# Patient Record
Sex: Female | Born: 1940 | Race: Black or African American | Hispanic: No | Marital: Single | Smoking: Never smoker
Health system: Southern US, Community
[De-identification: ages and names within clinical notes are randomized; demographics above are authoritative.]

## PROBLEM LIST (undated history)

## (undated) DIAGNOSIS — I10 Essential (primary) hypertension: Secondary | ICD-10-CM

## (undated) DIAGNOSIS — J189 Pneumonia, unspecified organism: Secondary | ICD-10-CM

## (undated) DIAGNOSIS — M1990 Unspecified osteoarthritis, unspecified site: Secondary | ICD-10-CM

## (undated) HISTORY — PX: ABDOMINAL HYSTERECTOMY: SHX81

## (undated) HISTORY — PX: FOOT ARTHROPLASTY: SHX1657

## (undated) HISTORY — PX: COLONOSCOPY: SHX174

---

## 1998-01-16 ENCOUNTER — Encounter: Admission: RE | Admit: 1998-01-16 | Discharge: 1998-01-16 | Payer: Self-pay | Admitting: Family Medicine

## 1998-03-16 ENCOUNTER — Ambulatory Visit (HOSPITAL_COMMUNITY): Admission: RE | Admit: 1998-03-16 | Discharge: 1998-03-16 | Payer: Self-pay | Admitting: Gastroenterology

## 1998-05-22 ENCOUNTER — Encounter: Admission: RE | Admit: 1998-05-22 | Discharge: 1998-05-22 | Payer: Self-pay | Admitting: Family Medicine

## 1998-05-25 ENCOUNTER — Encounter: Admission: RE | Admit: 1998-05-25 | Discharge: 1998-05-25 | Payer: Self-pay | Admitting: Family Medicine

## 1999-05-31 ENCOUNTER — Encounter: Admission: RE | Admit: 1999-05-31 | Discharge: 1999-05-31 | Payer: Self-pay | Admitting: Family Medicine

## 1999-06-21 ENCOUNTER — Encounter: Admission: RE | Admit: 1999-06-21 | Discharge: 1999-06-21 | Payer: Self-pay | Admitting: Family Medicine

## 1999-07-06 ENCOUNTER — Other Ambulatory Visit: Admission: RE | Admit: 1999-07-06 | Discharge: 1999-07-06 | Payer: Self-pay | Admitting: Obstetrics and Gynecology

## 1999-07-27 ENCOUNTER — Encounter: Payer: Self-pay | Admitting: Obstetrics and Gynecology

## 1999-07-27 ENCOUNTER — Encounter: Admission: RE | Admit: 1999-07-27 | Discharge: 1999-07-27 | Payer: Self-pay | Admitting: Obstetrics and Gynecology

## 1999-10-22 ENCOUNTER — Encounter: Admission: RE | Admit: 1999-10-22 | Discharge: 1999-10-22 | Payer: Self-pay | Admitting: Family Medicine

## 1999-10-25 ENCOUNTER — Encounter: Admission: RE | Admit: 1999-10-25 | Discharge: 1999-10-25 | Payer: Self-pay | Admitting: Family Medicine

## 1999-11-25 ENCOUNTER — Encounter: Admission: RE | Admit: 1999-11-25 | Discharge: 1999-11-25 | Payer: Self-pay | Admitting: Family Medicine

## 2000-06-26 ENCOUNTER — Encounter: Admission: RE | Admit: 2000-06-26 | Discharge: 2000-06-26 | Payer: Self-pay | Admitting: Family Medicine

## 2000-09-07 ENCOUNTER — Encounter: Admission: RE | Admit: 2000-09-07 | Discharge: 2000-09-07 | Payer: Self-pay | Admitting: Family Medicine

## 2000-09-12 ENCOUNTER — Encounter: Admission: RE | Admit: 2000-09-12 | Discharge: 2000-09-28 | Payer: Self-pay | Admitting: Sports Medicine

## 2001-05-10 ENCOUNTER — Encounter: Admission: RE | Admit: 2001-05-10 | Discharge: 2001-05-10 | Payer: Self-pay | Admitting: Sports Medicine

## 2001-05-11 ENCOUNTER — Encounter: Admission: RE | Admit: 2001-05-11 | Discharge: 2001-05-11 | Payer: Self-pay | Admitting: Family Medicine

## 2001-05-14 ENCOUNTER — Encounter: Admission: RE | Admit: 2001-05-14 | Discharge: 2001-05-14 | Payer: Self-pay | Admitting: Family Medicine

## 2001-09-26 ENCOUNTER — Encounter: Admission: RE | Admit: 2001-09-26 | Discharge: 2001-09-26 | Payer: Self-pay | Admitting: Family Medicine

## 2001-10-17 ENCOUNTER — Encounter: Payer: Self-pay | Admitting: General Surgery

## 2001-10-24 ENCOUNTER — Encounter (INDEPENDENT_AMBULATORY_CARE_PROVIDER_SITE_OTHER): Payer: Self-pay | Admitting: Specialist

## 2001-10-24 ENCOUNTER — Ambulatory Visit (HOSPITAL_COMMUNITY): Admission: RE | Admit: 2001-10-24 | Discharge: 2001-10-24 | Payer: Self-pay | Admitting: General Surgery

## 2002-05-08 ENCOUNTER — Encounter (INDEPENDENT_AMBULATORY_CARE_PROVIDER_SITE_OTHER): Payer: Self-pay | Admitting: *Deleted

## 2002-05-21 ENCOUNTER — Other Ambulatory Visit: Admission: RE | Admit: 2002-05-21 | Discharge: 2002-05-21 | Payer: Self-pay | Admitting: Obstetrics and Gynecology

## 2002-05-29 ENCOUNTER — Encounter: Payer: Self-pay | Admitting: Obstetrics and Gynecology

## 2002-05-29 ENCOUNTER — Encounter: Admission: RE | Admit: 2002-05-29 | Discharge: 2002-05-29 | Payer: Self-pay | Admitting: Obstetrics and Gynecology

## 2002-06-10 ENCOUNTER — Encounter: Admission: RE | Admit: 2002-06-10 | Discharge: 2002-06-10 | Payer: Self-pay | Admitting: Family Medicine

## 2002-11-08 ENCOUNTER — Encounter: Admission: RE | Admit: 2002-11-08 | Discharge: 2002-11-08 | Payer: Self-pay | Admitting: Family Medicine

## 2002-11-11 ENCOUNTER — Encounter: Admission: RE | Admit: 2002-11-11 | Discharge: 2002-11-11 | Payer: Self-pay | Admitting: Sports Medicine

## 2002-11-11 ENCOUNTER — Encounter: Payer: Self-pay | Admitting: Sports Medicine

## 2002-11-12 ENCOUNTER — Encounter: Admission: RE | Admit: 2002-11-12 | Discharge: 2002-12-31 | Payer: Self-pay | Admitting: Sports Medicine

## 2002-12-05 ENCOUNTER — Encounter: Admission: RE | Admit: 2002-12-05 | Discharge: 2002-12-05 | Payer: Self-pay | Admitting: Family Medicine

## 2003-01-28 ENCOUNTER — Encounter: Admission: RE | Admit: 2003-01-28 | Discharge: 2003-01-28 | Payer: Self-pay | Admitting: Family Medicine

## 2003-03-05 ENCOUNTER — Encounter: Admission: RE | Admit: 2003-03-05 | Discharge: 2003-03-05 | Payer: Self-pay | Admitting: Family Medicine

## 2003-03-07 ENCOUNTER — Encounter: Admission: RE | Admit: 2003-03-07 | Discharge: 2003-03-07 | Payer: Self-pay | Admitting: Family Medicine

## 2003-03-18 ENCOUNTER — Encounter: Admission: RE | Admit: 2003-03-18 | Discharge: 2003-03-18 | Payer: Self-pay | Admitting: Family Medicine

## 2003-04-10 ENCOUNTER — Encounter: Admission: RE | Admit: 2003-04-10 | Discharge: 2003-04-10 | Payer: Self-pay | Admitting: Family Medicine

## 2003-06-09 ENCOUNTER — Encounter: Admission: RE | Admit: 2003-06-09 | Discharge: 2003-06-09 | Payer: Self-pay | Admitting: Family Medicine

## 2003-06-18 ENCOUNTER — Encounter: Admission: RE | Admit: 2003-06-18 | Discharge: 2003-06-18 | Payer: Self-pay | Admitting: Family Medicine

## 2003-09-10 ENCOUNTER — Ambulatory Visit (HOSPITAL_COMMUNITY): Admission: RE | Admit: 2003-09-10 | Discharge: 2003-09-10 | Payer: Self-pay | Admitting: Gastroenterology

## 2003-09-10 ENCOUNTER — Encounter (INDEPENDENT_AMBULATORY_CARE_PROVIDER_SITE_OTHER): Payer: Self-pay | Admitting: *Deleted

## 2003-11-19 ENCOUNTER — Encounter: Admission: RE | Admit: 2003-11-19 | Discharge: 2003-11-19 | Payer: Self-pay | Admitting: Family Medicine

## 2003-12-15 ENCOUNTER — Encounter: Admission: RE | Admit: 2003-12-15 | Discharge: 2004-01-28 | Payer: Self-pay | Admitting: Sports Medicine

## 2003-12-25 ENCOUNTER — Emergency Department (HOSPITAL_COMMUNITY): Admission: EM | Admit: 2003-12-25 | Discharge: 2003-12-25 | Payer: Self-pay | Admitting: Family Medicine

## 2004-02-05 ENCOUNTER — Encounter: Admission: RE | Admit: 2004-02-05 | Discharge: 2004-02-05 | Payer: Self-pay | Admitting: Sports Medicine

## 2004-05-17 ENCOUNTER — Ambulatory Visit: Payer: Self-pay | Admitting: Sports Medicine

## 2004-06-23 ENCOUNTER — Ambulatory Visit: Payer: Self-pay | Admitting: Family Medicine

## 2004-07-07 ENCOUNTER — Ambulatory Visit: Payer: Self-pay | Admitting: Family Medicine

## 2004-07-21 ENCOUNTER — Ambulatory Visit: Payer: Self-pay | Admitting: Sports Medicine

## 2004-10-12 ENCOUNTER — Ambulatory Visit: Payer: Self-pay | Admitting: Family Medicine

## 2004-10-20 ENCOUNTER — Ambulatory Visit: Payer: Self-pay | Admitting: Family Medicine

## 2004-10-27 ENCOUNTER — Ambulatory Visit (HOSPITAL_COMMUNITY): Admission: RE | Admit: 2004-10-27 | Discharge: 2004-10-27 | Payer: Self-pay | Admitting: Family Medicine

## 2004-10-27 ENCOUNTER — Encounter: Payer: Self-pay | Admitting: Cardiology

## 2004-10-27 ENCOUNTER — Ambulatory Visit: Payer: Self-pay | Admitting: Cardiology

## 2005-03-09 ENCOUNTER — Other Ambulatory Visit: Admission: RE | Admit: 2005-03-09 | Discharge: 2005-03-09 | Payer: Self-pay | Admitting: Obstetrics and Gynecology

## 2005-04-15 ENCOUNTER — Encounter: Admission: RE | Admit: 2005-04-15 | Discharge: 2005-04-15 | Payer: Self-pay | Admitting: Obstetrics and Gynecology

## 2005-05-09 ENCOUNTER — Ambulatory Visit: Payer: Self-pay | Admitting: Family Medicine

## 2005-05-24 ENCOUNTER — Ambulatory Visit: Payer: Self-pay | Admitting: Family Medicine

## 2005-11-16 ENCOUNTER — Ambulatory Visit: Payer: Self-pay | Admitting: Family Medicine

## 2005-11-23 ENCOUNTER — Ambulatory Visit: Payer: Self-pay | Admitting: Sports Medicine

## 2005-12-15 ENCOUNTER — Ambulatory Visit: Payer: Self-pay | Admitting: Family Medicine

## 2005-12-16 ENCOUNTER — Ambulatory Visit: Payer: Self-pay | Admitting: Family Medicine

## 2006-01-06 ENCOUNTER — Emergency Department (HOSPITAL_COMMUNITY): Admission: EM | Admit: 2006-01-06 | Discharge: 2006-01-06 | Payer: Self-pay | Admitting: Family Medicine

## 2006-08-08 DIAGNOSIS — M1710 Unilateral primary osteoarthritis, unspecified knee: Secondary | ICD-10-CM

## 2006-08-08 DIAGNOSIS — J309 Allergic rhinitis, unspecified: Secondary | ICD-10-CM

## 2006-08-08 HISTORY — DX: Unilateral primary osteoarthritis, unspecified knee: M17.10

## 2006-08-08 HISTORY — DX: Allergic rhinitis, unspecified: J30.9

## 2006-10-05 DIAGNOSIS — E785 Hyperlipidemia, unspecified: Secondary | ICD-10-CM

## 2006-10-05 DIAGNOSIS — M1710 Unilateral primary osteoarthritis, unspecified knee: Secondary | ICD-10-CM

## 2006-10-05 DIAGNOSIS — J309 Allergic rhinitis, unspecified: Secondary | ICD-10-CM | POA: Insufficient documentation

## 2006-10-05 DIAGNOSIS — IMO0002 Reserved for concepts with insufficient information to code with codable children: Secondary | ICD-10-CM | POA: Insufficient documentation

## 2006-10-05 DIAGNOSIS — J45909 Unspecified asthma, uncomplicated: Secondary | ICD-10-CM | POA: Insufficient documentation

## 2006-10-05 HISTORY — DX: Hyperlipidemia, unspecified: E78.5

## 2006-10-06 ENCOUNTER — Encounter (INDEPENDENT_AMBULATORY_CARE_PROVIDER_SITE_OTHER): Payer: Self-pay | Admitting: *Deleted

## 2008-06-06 ENCOUNTER — Ambulatory Visit (HOSPITAL_COMMUNITY): Admission: RE | Admit: 2008-06-06 | Discharge: 2008-06-06 | Payer: Self-pay | Admitting: Obstetrics and Gynecology

## 2008-08-04 ENCOUNTER — Encounter: Admission: RE | Admit: 2008-08-04 | Discharge: 2008-08-04 | Payer: Self-pay | Admitting: Internal Medicine

## 2008-08-08 DIAGNOSIS — G629 Polyneuropathy, unspecified: Secondary | ICD-10-CM

## 2008-08-08 HISTORY — DX: Polyneuropathy, unspecified: G62.9

## 2009-06-26 DIAGNOSIS — I10 Essential (primary) hypertension: Secondary | ICD-10-CM | POA: Insufficient documentation

## 2009-06-26 DIAGNOSIS — G629 Polyneuropathy, unspecified: Secondary | ICD-10-CM | POA: Insufficient documentation

## 2009-06-26 DIAGNOSIS — E7800 Pure hypercholesterolemia, unspecified: Secondary | ICD-10-CM | POA: Insufficient documentation

## 2009-06-26 DIAGNOSIS — J4540 Moderate persistent asthma, uncomplicated: Secondary | ICD-10-CM | POA: Insufficient documentation

## 2009-06-26 DIAGNOSIS — R7309 Other abnormal glucose: Secondary | ICD-10-CM | POA: Insufficient documentation

## 2009-07-21 DIAGNOSIS — M899 Disorder of bone, unspecified: Secondary | ICD-10-CM | POA: Insufficient documentation

## 2009-07-21 HISTORY — DX: Disorder of bone, unspecified: M89.9

## 2009-08-04 ENCOUNTER — Encounter: Admission: RE | Admit: 2009-08-04 | Discharge: 2009-08-04 | Payer: Self-pay | Admitting: Neurological Surgery

## 2009-11-07 ENCOUNTER — Emergency Department (HOSPITAL_COMMUNITY): Admission: EM | Admit: 2009-11-07 | Discharge: 2009-11-07 | Payer: Self-pay | Admitting: Family Medicine

## 2010-12-24 NOTE — Op Note (Signed)
NAME:  Kirsten Horton, Kirsten Horton                      ACCOUNT NO.:  [id number]   MEDICAL RECORD NO.:  [id number]                   PATIENT TYPE:  AMB   LOCATION:  ENDO                                 FACILITY:  [facility]   PHYSICIAN:  Petra Kuba, M.D.                 DATE OF BIRTH:  Oct 12, 1940   DATE OF PROCEDURE:  09/10/2003  DATE OF DISCHARGE:                                 OPERATIVE REPORT   PROCEDURE:  Colonoscopy with hot biopsy.   INDICATIONS FOR PROCEDURE:  Patient with a family history of colon cancer  for screening.  Consent was signed after risks, benefits, methods, and  options were thoroughly discussed in the past.   MEDICATIONS USED:  Demerol 70, Versed 7.   PROCEDURE:  Rectal inspection was pertinent for external hemorrhoids.  Digital exam was negative.  The video pediatric adjustable colonoscope was  inserted and easily advanced around the colon to the cecum.  It did not  require any abdominal pressure or position changes.  No abnormality was seen  on insertion.  The cecum was identified by the appendiceal orifice and the  ileocecal valve.  In the cecum, a tiny polyp was seen and was carefully hot  biopsied on a setting of 15 using the Erbe machine.  The scope was then  slowly withdrawn.  The prep was adequate.  There was some liquid stool that  required washing and suctioning.  On slow withdrawal through the colon, no  other polyps, diverticula, or other abnormalities were seen as we slowly  withdrew back to the rectum.  Anorectal pull through and retroflexion  confirmed some small hemorrhoids.  The scope was reinserted a short ways up  the left side of the colon, air was suctioned, the scope was removed.  The  patient tolerated the procedure well.  There was no obvious complications.   ENDOSCOPIC DIAGNOSIS:  1. Internal and external hemorrhoids.  2. Tiny cecal polyp hot biopsied.  3. Otherwise, within normal limits to the cecum.   PLAN:  Await pathology, probably  repeat screening in five years.  Happy to  see back p.r.n.  Otherwise, return care to Dr. Stefano Gaul and the family  practice service for the customary health care maintenance to include yearly  rectals and guaiacs.                                               Petra Kuba, M.D.    MEM/MEDQ  D:  09/10/2003  T:  09/10/2003  Job:  [id number]   cc:   Janine Limbo, M.D.  [address]  [place]  [place] [address]  Fax: [phone]

## 2010-12-24 NOTE — Op Note (Signed)
[facility]  Patient:    Kirsten Horton, Kirsten Horton Visit Number: [id number] MRN: [id number]          Service Type: DSU Location: DAY Attending Physician:  Caleen Essex Dictated by:   Ollen Gross. Vernell Morgans, M.D. Proc. Date: 10/24/01 Admit Date:  10/24/2001                             Operative Report  PREOPERATIVE DIAGNOSIS:  Mass in left shoulder.  POSTOPERATIVE DIAGNOSIS:  Mass in left shoulder.  PROCEDURE:  Excision of mass from the left shoulder, approximately 6 cm.  SURGEON:  Ollen Gross. Vernell Morgans, M.D.  ANESTHESIA:  General via LMA.  DESCRIPTION OF PROCEDURE:  After informed consent was obtained, the patient was brought to the operating room and placed in the supine position on the operating room table. After having induction of general anesthesia, the patients left shoulder was bumped up with a small roll of towels. The patients left shoulder was then prepped with Betadine and draped in the usual sterile manner. A longitudinal incision was made with a #15 blade knife overlying the mass in question. This incision was carried down through the skin and subcutaneous tissues using Bovie electrocautery. Using blunt dissection, the mass was able to be freed from much of the surrounding subcutaneous tissue. Some of the attachments of the mass to the subcutaneous tissue were taken down sharply with the Bovie electrocautery. Once this was complete, the mass was able to be completely separated from the rest of the subcutaneous tissue and removed. This was sent to the pathologist for further evaluation. The wound was examined and found to be hemostatic. The incision was then closed with interrupted 3-0 nylon vertical mattress sutures. Triple antibiotic and sterile dressings were applied. The patient tolerated the procedure well. At the end of the case, all needle, sponge, and instrument counts were correct. The patient was then awakened and taken to the recovery room  in stable condition. Dictated by:   Ollen Gross. Vernell Morgans, M.D. Attending Physician:  Caleen Essex DD:  10/24/01 TD:  10/25/01 Job: [id number]

## 2011-11-15 ENCOUNTER — Encounter (HOSPITAL_COMMUNITY): Payer: Self-pay | Admitting: Pharmacy Technician

## 2011-11-22 ENCOUNTER — Encounter (HOSPITAL_COMMUNITY)
Admission: RE | Admit: 2011-11-22 | Discharge: 2011-11-22 | Disposition: A | Discharge status: Home / Self Care | Payer: Medicare Other | Source: Ambulatory Visit | Attending: Orthopedic Surgery | Admitting: Orthopedic Surgery

## 2011-11-22 ENCOUNTER — Encounter (HOSPITAL_COMMUNITY): Payer: Self-pay

## 2011-11-22 ENCOUNTER — Encounter (HOSPITAL_COMMUNITY)
Admission: RE | Admit: 2011-11-22 | Discharge: 2011-11-22 | Disposition: A | Discharge status: Home / Self Care | Payer: Medicare Other | Source: Ambulatory Visit | Attending: Anesthesiology | Admitting: Anesthesiology

## 2011-11-22 HISTORY — DX: Unspecified osteoarthritis, unspecified site: M19.90

## 2011-11-22 LAB — BASIC METABOLIC PANEL
CO2: 29 mEq/L (ref 19–32)
Calcium: 9.3 mg/dL (ref 8.4–10.5)
GFR calc non Af Amer: 88 mL/min — ABNORMAL LOW (ref >90)
Glucose, Bld: 108 mg/dL — ABNORMAL HIGH (ref 70–99)
Potassium: 4 mEq/L (ref 3.5–5.1)
Sodium: 143 mEq/L (ref 135–145)

## 2011-11-22 LAB — CBC
Hemoglobin: 13 g/dL (ref 12.0–15.0)
MCH: 31.8 pg (ref 26.0–34.0)
MCV: 97.6 fL (ref 78.0–100.0)
Platelets: 266 10*3/uL (ref 150–400)
RBC: 4.09 MIL/uL (ref 3.87–5.11)
WBC: 8.3 10*3/uL (ref 4.0–10.5)

## 2011-11-22 LAB — ABO/RH: ABO/RH(D): A POS

## 2011-11-22 LAB — HEPATIC FUNCTION PANEL
ALT: 10 U/L (ref 0–35)
Bilirubin, Direct: <0.1 mg/dL (ref 0.0–0.3)
Total Protein: 6.9 g/dL (ref 6.0–8.3)

## 2011-11-22 LAB — SURGICAL PCR SCREEN: Staphylococcus aureus: NEGATIVE

## 2011-11-22 LAB — PROTIME-INR: Prothrombin Time: 14.2 seconds (ref 11.6–15.2)

## 2011-11-22 LAB — APTT: aPTT: 30 seconds (ref 24–37)

## 2011-11-22 NOTE — Progress Notes (Signed)
Called for orders. 

## 2011-11-22 NOTE — Pre-Procedure Instructions (Signed)
[address]  11/22/2011   Your procedure is scheduled on:  Wednesday November 30, 2011  Report to [facility] at 0630 AM.  Call this number if you have problems the morning of surgery: [phone]   Remember:   Do not eat food:After Midnight.  May have clear liquids: up to 4 Hours before arrival. (up to 2:30am)  Clear liquids include soda, tea, black coffee, apple or grape juice, broth.  Take these medicines the morning of surgery with A SIP OF WATER: tramadol, lyrica, evista   Do not wear jewelry, make-up or nail polish.  Do not wear lotions, powders, or perfumes. You may wear deodorant.  Do not shave 48 hours prior to surgery.  Do not bring valuables to the hospital.  Contacts, dentures or bridgework may not be worn into surgery.  Leave suitcase in the car. After surgery it may be brought to your room.  For patients admitted to the hospital, checkout time is 11:00 AM the day of discharge.   Patients discharged the day of surgery will not be allowed to drive home.  Name and phone number of your driver: family  Special Instructions: CHG Shower Use Special Wash: 1/2 bottle night before surgery and 1/2 bottle morning of surgery.   Please read over the following fact sheets that you were given: Pain Booklet, Coughing and Deep Breathing, Blood Transfusion Information, Total Joint Packet, MRSA Information and Surgical Site Infection Prevention

## 2011-11-24 NOTE — Consult Note (Signed)
Anesthesia Chart Review:  Patient is a 71 year old female scheduled for a left TKR on 11/30/11.  History includes non-smoker, asthma, arthritis, obesity with BMI 35, TAH, excision of left shoulder mass in 10/2001.  PCP is listed as Dr. Guerry Bruin.  His notes also mention impaired glucose intolerance.    Labs acceptable.  UA is currently scheduled for the day of surgery.  (Apparently, there were no surgeon orders at her PAT appointment.)  CXR on 11/22/11 showed no acute process.  Her EKG shows NSR, minimal voltage criteria for LVH, inferior infarct (age undetermined), cannot rule out anterior infarct (age undetermined).  Her EKG appears stable since 10/17/01.  There are no other EKGs at her PCP office.  There were no CV symptoms documented at her last PCP and PAT visit.  Her last echo was on 10/27/04 and showed: - Grade 1 diastolic dysfunction. Overall left ventricular systolic function was vigorous. Left ventricular ejection fraction was estimated , range being 65 % to 70 %. Left ventricular wall thickness was mildly increased. - The aortic valve was mildly calcified. There was mild aortic valvular regurgitation. - Left atrial size was at the upper limits of normal. - There was mild tricuspid valvular regurgitation. - There was a minimal pericardial effusion, without hemodynamic compromise.  If remains asymptomatic then plan to proceed, as EKG is stable for 10 years and no known CAD.  Anesthesiologist Dr. Chaney Malling has reviewed her EKGs as well and agrees with this plan.

## 2011-11-25 NOTE — Progress Notes (Signed)
Notified Lynne Logan when pt. Was here for PAT visit we had no orders and a UA was not done. Stated to get UA day of surgery.

## 2011-11-29 MED ORDER — VANCOMYCIN HCL IN DEXTROSE 1-5 GM/200ML-% IV SOLN
1000.0000 mg | INTRAVENOUS | Status: AC
  Administered 2011-11-30: 1000 mg via INTRAVENOUS
  Filled 2011-11-29 (×2): qty 200

## 2011-11-29 NOTE — H&P (Signed)
[facility] [address]   [address] [place], [place] [id number] [phone] A Division of [facility]  Loreta Ave, M.D.     Robert A. Thurston Hole, M.D.     Lunette Stands, M.D. Eulas Post, M.D.    Buford Dresser, M.D. Estell Harpin, M.D. Ralene Cork, D.O.          Genene Churn. Barry Dienes, PA-C            Kirstin A. Shepperson, PA-C [place], OPA-C   RE: Kirsten, Horton                                [id number]      DOB: January 29, 1941 PROGRESS NOTE: 11-18-11 Chief complaint: Left knee pain.  History of present illness: 33 one year-old black female with a history of end stage DJD, left knee, and chronic pain.  Returns.  States that knee symptoms are unchanged from previous visit.  She is wanting to proceed with total knee replacement as scheduled.  Patient states that she has a niece and a granddaughter who will be helping her at home post-op.   Current medications: Naproxen, Lyrica, Evista, Advair and Ventolin. Allergies: Penicillin.       Past medical/surgical history: Hypertension, hyperlipidemia, asthma, seasonal allergic rhinitis and osteoporosis.   Review of systems: Patient denies cardiac, pulmonary, GI or GU issues.  Denies fevers or chills.   Family history: Positive for arthritis, cancer and hypertension. Social history: Patient is single and has two children.  Currently retired.    EXAMINATION: Temperature: 98.7.  Respirations: 16.  Blood pressure: 164/81.  Pulse: 75.  Height: 5?2.  Weight: 182 pounds.  Pleasant black female, alert and oriented x 3 and in no acute distress.  Head is normocephalic, a traumatic.  PERRLA, EOMI.  Cervical spine unremarkable.  Lungs: CTA bilaterally.  No wheezes.  Heart: RRR.  S1 and S2.  No murmurs.  Abdomen: Round and non-distended.  NBS x 4.  Soft and non-tender.  Gait antalgic.  Left knee: Decreased range of motion.  Positive crepitus.  Joint line tender.  1-2+ effusion.   Ligaments stable.  Calf non-tender.  Neurovascularly intact.  Skin warm and dry.   IMPRESSION: End stage DJD, left knee, and pain.  Failed conservative treatment.   PLAN:  We will proceed with left total knee replacement as scheduled.  Surgical procedure, along with potential rehab/recovery time discussed.  All questions answered.    Genene Churn. Barry Dienes, PA-C   Electronically verified by Loreta Ave, M.D. JMO:jjh D 11-18-11 T 11-21-11

## 2011-11-30 ENCOUNTER — Encounter (HOSPITAL_COMMUNITY)
Admission: RE | Disposition: A | Discharge status: Home / Self Care | Payer: Self-pay | Source: Ambulatory Visit | Attending: Orthopedic Surgery

## 2011-11-30 ENCOUNTER — Inpatient Hospital Stay (HOSPITAL_COMMUNITY)
Admission: RE | Admit: 2011-11-30 | Discharge: 2011-12-03 | DRG: 470 | Disposition: A | Discharge status: Home / Self Care | Payer: Medicare Other | Source: Ambulatory Visit | Attending: Orthopedic Surgery | Admitting: Orthopedic Surgery

## 2011-11-30 ENCOUNTER — Encounter (HOSPITAL_COMMUNITY): Payer: Self-pay | Admitting: Vascular Surgery

## 2011-11-30 ENCOUNTER — Inpatient Hospital Stay (HOSPITAL_COMMUNITY): Payer: Medicare Other

## 2011-11-30 ENCOUNTER — Ambulatory Visit (HOSPITAL_COMMUNITY): Payer: Medicare Other | Admitting: Vascular Surgery

## 2011-11-30 DIAGNOSIS — IMO0002 Reserved for concepts with insufficient information to code with codable children: Principal | ICD-10-CM | POA: Diagnosis present

## 2011-11-30 DIAGNOSIS — Z471 Aftercare following joint replacement surgery: Secondary | ICD-10-CM

## 2011-11-30 DIAGNOSIS — J45909 Unspecified asthma, uncomplicated: Secondary | ICD-10-CM | POA: Diagnosis present

## 2011-11-30 DIAGNOSIS — E785 Hyperlipidemia, unspecified: Secondary | ICD-10-CM | POA: Diagnosis present

## 2011-11-30 DIAGNOSIS — Z01812 Encounter for preprocedural laboratory examination: Secondary | ICD-10-CM

## 2011-11-30 DIAGNOSIS — I10 Essential (primary) hypertension: Secondary | ICD-10-CM | POA: Diagnosis present

## 2011-11-30 DIAGNOSIS — M1710 Unilateral primary osteoarthritis, unspecified knee: Principal | ICD-10-CM | POA: Diagnosis present

## 2011-11-30 HISTORY — PX: TOTAL KNEE ARTHROPLASTY: SHX125

## 2011-11-30 LAB — URINALYSIS, ROUTINE W REFLEX MICROSCOPIC
Nitrite: NEGATIVE
Specific Gravity, Urine: 1.015 (ref 1.005–1.030)
Urobilinogen, UA: 0.2 mg/dL (ref 0.0–1.0)
pH: 6 (ref 5.0–8.0)

## 2011-11-30 LAB — URINE MICROSCOPIC-ADD ON

## 2011-11-30 SURGERY — ARTHROPLASTY, KNEE, TOTAL
Anesthesia: General | Site: Knee | Laterality: Left | Wound class: Clean

## 2011-11-30 MED ORDER — ALBUTEROL SULFATE HFA 108 (90 BASE) MCG/ACT IN AERS: 2.0000 | INHALATION_SPRAY | Freq: Four times a day (QID) | RESPIRATORY_TRACT | Status: DC | PRN

## 2011-11-30 MED ORDER — WARFARIN VIDEO
Freq: Once | Status: AC
  Administered 2011-12-01: 19:00:00

## 2011-11-30 MED ORDER — SENNOSIDES-DOCUSATE SODIUM 8.6-50 MG PO TABS: 1.0000 | ORAL_TABLET | Freq: Every evening | ORAL | Status: DC | PRN

## 2011-11-30 MED ORDER — FENTANYL CITRATE 0.05 MG/ML IJ SOLN
INTRAMUSCULAR | Status: DC | PRN
  Administered 2011-11-30 (×2): 100 ug via INTRAVENOUS
  Administered 2011-11-30: 50 ug via INTRAVENOUS

## 2011-11-30 MED ORDER — HYDROMORPHONE HCL PF 1 MG/ML IJ SOLN: 0.5000 mg | INTRAMUSCULAR | Status: DC | PRN

## 2011-11-30 MED ORDER — SUCCINYLCHOLINE CHLORIDE 20 MG/ML IJ SOLN
INTRAMUSCULAR | Status: DC | PRN
  Administered 2011-11-30: 100 mg via INTRAVENOUS

## 2011-11-30 MED ORDER — COUMADIN BOOK
Freq: Once | Status: DC
  Filled 2011-11-30: qty 1

## 2011-11-30 MED ORDER — OXYCODONE-ACETAMINOPHEN 5-325 MG PO TABS
1.0000 | ORAL_TABLET | ORAL | Status: DC | PRN
Start: 2011-11-30 — End: 2011-12-03
  Administered 2011-11-30 – 2011-12-02 (×9): 1 via ORAL
  Administered 2011-12-02: 2 via ORAL
  Administered 2011-12-02 (×3): 1 via ORAL
  Administered 2011-12-03 (×2): 2 via ORAL
  Filled 2011-11-30: qty 1
  Filled 2011-11-30: qty 2
  Filled 2011-11-30 (×2): qty 1
  Filled 2011-11-30 (×2): qty 2
  Filled 2011-11-30 (×2): qty 1
  Filled 2011-11-30: qty 2
  Filled 2011-11-30 (×7): qty 1

## 2011-11-30 MED ORDER — ENOXAPARIN SODIUM 30 MG/0.3ML ~~LOC~~ SOLN
30.0000 mg | Freq: Two times a day (BID) | SUBCUTANEOUS | Status: DC
  Administered 2011-12-01 – 2011-12-03 (×5): 30 mg via SUBCUTANEOUS
  Filled 2011-11-30 (×7): qty 0.3

## 2011-11-30 MED ORDER — LACTATED RINGERS IV SOLN
INTRAVENOUS | Status: DC | PRN
  Administered 2011-11-30 (×2): via INTRAVENOUS

## 2011-11-30 MED ORDER — DIPHENHYDRAMINE HCL 12.5 MG/5ML PO ELIX: 12.5000 mg | ORAL_SOLUTION | ORAL | Status: DC | PRN

## 2011-11-30 MED ORDER — ACETAMINOPHEN 10 MG/ML IV SOLN
INTRAVENOUS | Status: DC | PRN
  Administered 2011-11-30: 1000 mg via INTRAVENOUS

## 2011-11-30 MED ORDER — METHOCARBAMOL 500 MG PO TABS
500.0000 mg | ORAL_TABLET | Freq: Four times a day (QID) | ORAL | Status: DC | PRN
  Administered 2011-12-01 – 2011-12-02 (×5): 500 mg via ORAL
  Filled 2011-11-30 (×6): qty 1

## 2011-11-30 MED ORDER — RALOXIFENE HCL 60 MG PO TABS
60.0000 mg | ORAL_TABLET | Freq: Every day | ORAL | Status: DC
  Administered 2011-11-30 – 2011-12-03 (×4): 60 mg via ORAL
  Filled 2011-11-30 (×4): qty 1

## 2011-11-30 MED ORDER — MENTHOL 3 MG MT LOZG: 1.0000 | LOZENGE | OROMUCOSAL | Status: DC | PRN

## 2011-11-30 MED ORDER — HYDROMORPHONE HCL PF 1 MG/ML IJ SOLN
0.2500 mg | INTRAMUSCULAR | Status: DC | PRN
  Administered 2011-11-30 (×3): 0.5 mg via INTRAVENOUS

## 2011-11-30 MED ORDER — ONDANSETRON HCL 4 MG/2ML IJ SOLN: 4.0000 mg | Freq: Four times a day (QID) | INTRAMUSCULAR | Status: DC | PRN

## 2011-11-30 MED ORDER — NEOSTIGMINE METHYLSULFATE 1 MG/ML IJ SOLN
INTRAMUSCULAR | Status: DC | PRN
  Administered 2011-11-30: 4 mg via INTRAVENOUS

## 2011-11-30 MED ORDER — ACETAMINOPHEN 650 MG RE SUPP: 650.0000 mg | Freq: Four times a day (QID) | RECTAL | Status: DC | PRN

## 2011-11-30 MED ORDER — POTASSIUM CHLORIDE IN NACL 20-0.9 MEQ/L-% IV SOLN
INTRAVENOUS | Status: DC
  Administered 2011-11-30: 20:00:00 via INTRAVENOUS
  Filled 2011-11-30 (×8): qty 1000

## 2011-11-30 MED ORDER — LIDOCAINE HCL (CARDIAC) 20 MG/ML IV SOLN
INTRAVENOUS | Status: DC | PRN
  Administered 2011-11-30: 50 mg via INTRAVENOUS

## 2011-11-30 MED ORDER — FLUTICASONE-SALMETEROL 250-50 MCG/DOSE IN AEPB
1.0000 | INHALATION_SPRAY | Freq: Two times a day (BID) | RESPIRATORY_TRACT | Status: DC
  Administered 2011-11-30 – 2011-12-01 (×4): 1 via RESPIRATORY_TRACT
  Filled 2011-11-30: qty 14

## 2011-11-30 MED ORDER — METHOCARBAMOL 100 MG/ML IJ SOLN
500.0000 mg | INTRAVENOUS | Status: AC
  Administered 2011-11-30: 500 mg via INTRAVENOUS
  Filled 2011-11-30: qty 5

## 2011-11-30 MED ORDER — VANCOMYCIN HCL IN DEXTROSE 1-5 GM/200ML-% IV SOLN
1000.0000 mg | Freq: Two times a day (BID) | INTRAVENOUS | Status: AC
  Administered 2011-11-30: 1000 mg via INTRAVENOUS
  Filled 2011-11-30: qty 200

## 2011-11-30 MED ORDER — GLYCOPYRROLATE 0.2 MG/ML IJ SOLN
INTRAMUSCULAR | Status: DC | PRN
  Administered 2011-11-30: 0.6 mg via INTRAVENOUS

## 2011-11-30 MED ORDER — ACETAMINOPHEN 325 MG PO TABS: 650.0000 mg | ORAL_TABLET | Freq: Four times a day (QID) | ORAL | Status: DC | PRN

## 2011-11-30 MED ORDER — DEXAMETHASONE SODIUM PHOSPHATE 10 MG/ML IJ SOLN
INTRAMUSCULAR | Status: DC | PRN
  Administered 2011-11-30: 10 mg via INTRAVENOUS

## 2011-11-30 MED ORDER — PROPOFOL 10 MG/ML IV EMUL
INTRAVENOUS | Status: DC | PRN
  Administered 2011-11-30: 200 mg via INTRAVENOUS

## 2011-11-30 MED ORDER — PHENOL 1.4 % MT LIQD: 1.0000 | OROMUCOSAL | Status: DC | PRN

## 2011-11-30 MED ORDER — PNEUMOCOCCAL VAC POLYVALENT 25 MCG/0.5ML IJ INJ
0.5000 mL | INJECTION | INTRAMUSCULAR | Status: AC
  Administered 2011-12-01: 0.5 mL via INTRAMUSCULAR
  Filled 2011-11-30: qty 0.5

## 2011-11-30 MED ORDER — ROCURONIUM BROMIDE 100 MG/10ML IV SOLN
INTRAVENOUS | Status: DC | PRN
  Administered 2011-11-30: 40 mg via INTRAVENOUS

## 2011-11-30 MED ORDER — ONDANSETRON HCL 4 MG/2ML IJ SOLN
INTRAMUSCULAR | Status: DC | PRN
  Administered 2011-11-30: 4 mg via INTRAVENOUS

## 2011-11-30 MED ORDER — METHOCARBAMOL 100 MG/ML IJ SOLN
500.0000 mg | Freq: Four times a day (QID) | INTRAVENOUS | Status: DC | PRN
  Filled 2011-11-30: qty 5

## 2011-11-30 MED ORDER — WARFARIN - PHARMACIST DOSING INPATIENT: Freq: Every day | Status: DC

## 2011-11-30 MED ORDER — FLEET ENEMA 7-19 GM/118ML RE ENEM: 1.0000 | ENEMA | Freq: Once | RECTAL | Status: AC | PRN

## 2011-11-30 MED ORDER — MIDAZOLAM HCL 5 MG/5ML IJ SOLN
INTRAMUSCULAR | Status: DC | PRN
  Administered 2011-11-30: 2 mg via INTRAVENOUS

## 2011-11-30 MED ORDER — DOCUSATE SODIUM 100 MG PO CAPS
100.0000 mg | ORAL_CAPSULE | Freq: Two times a day (BID) | ORAL | Status: DC
  Administered 2011-11-30 – 2011-12-03 (×6): 100 mg via ORAL
  Filled 2011-11-30 (×8): qty 1

## 2011-11-30 MED ORDER — SODIUM CHLORIDE 0.9 % IR SOLN
Status: DC | PRN
  Administered 2011-11-30: 1000 mL
  Administered 2011-11-30: 3000 mL

## 2011-11-30 MED ORDER — BUPIVACAINE-EPINEPHRINE PF 0.5-1:200000 % IJ SOLN
INTRAMUSCULAR | Status: DC | PRN
  Administered 2011-11-30: 30 mL

## 2011-11-30 MED ORDER — MORPHINE SULFATE 4 MG/ML IJ SOLN
INTRAMUSCULAR | Status: DC | PRN
  Administered 2011-11-30: 4 mg

## 2011-11-30 MED ORDER — PREGABALIN 75 MG PO CAPS
75.0000 mg | ORAL_CAPSULE | Freq: Two times a day (BID) | ORAL | Status: DC
  Administered 2011-11-30 – 2011-12-03 (×6): 75 mg via ORAL
  Filled 2011-11-30 (×6): qty 1

## 2011-11-30 MED ORDER — ONDANSETRON HCL 4 MG PO TABS: 4.0000 mg | ORAL_TABLET | Freq: Four times a day (QID) | ORAL | Status: DC | PRN

## 2011-11-30 MED ORDER — BUPIVACAINE HCL (PF) 0.25 % IJ SOLN
INTRAMUSCULAR | Status: DC | PRN
  Administered 2011-11-30: 30 mL

## 2011-11-30 MED ORDER — WARFARIN SODIUM 7.5 MG PO TABS
7.5000 mg | ORAL_TABLET | Freq: Once | ORAL | Status: AC
  Administered 2011-11-30: 7.5 mg via ORAL
  Filled 2011-11-30: qty 1

## 2011-11-30 SURGICAL SUPPLY — 61 items
BANDAGE ESMARK 6X9 LF (GAUZE/BANDAGES/DRESSINGS) ×1 IMPLANT
BLADE SAG 18X100X1.27 (BLADE) ×4 IMPLANT
BNDG CMPR 9X6 STRL LF SNTH (GAUZE/BANDAGES/DRESSINGS) ×1
BNDG ESMARK 6X9 LF (GAUZE/BANDAGES/DRESSINGS) ×2
BOOTCOVER CLEANROOM LRG (PROTECTIVE WEAR) ×4 IMPLANT
BOWL SMART MIX CTS (DISPOSABLE) ×2 IMPLANT
CEMENT BONE SIMPLEX SPEEDSET (Cement) ×4 IMPLANT
CLOTH BEACON ORANGE TIMEOUT ST (SAFETY) ×2 IMPLANT
COVER BACK TABLE 24X17X13 BIG (DRAPES) ×1 IMPLANT
COVER SURGICAL LIGHT HANDLE (MISCELLANEOUS) ×2 IMPLANT
CUFF TOURNIQUET SINGLE 34IN LL (TOURNIQUET CUFF) ×2 IMPLANT
DRAPE EXTREMITY T 121X128X90 (DRAPE) ×2 IMPLANT
DRAPE PROXIMA HALF (DRAPES) ×2 IMPLANT
DRAPE U-SHAPE 47X51 STRL (DRAPES) ×2 IMPLANT
DRSG PAD ABDOMINAL 8X10 ST (GAUZE/BANDAGES/DRESSINGS) ×1 IMPLANT
DURAPREP 26ML APPLICATOR (WOUND CARE) ×2 IMPLANT
ELECT CAUTERY BLADE 6.4 (BLADE) ×2 IMPLANT
ELECT REM PT RETURN 9FT ADLT (ELECTROSURGICAL) ×2
ELECTRODE REM PT RTRN 9FT ADLT (ELECTROSURGICAL) ×1 IMPLANT
EVACUATOR 1/8 PVC DRAIN (DRAIN) ×2 IMPLANT
FACESHIELD LNG OPTICON STERILE (SAFETY) ×3 IMPLANT
GAUZE XEROFORM 1X8 LF (GAUZE/BANDAGES/DRESSINGS) ×1 IMPLANT
GAUZE XEROFORM 5X9 LF (GAUZE/BANDAGES/DRESSINGS) ×1 IMPLANT
GLOVE BIOGEL PI IND STRL 8 (GLOVE) ×1 IMPLANT
GLOVE BIOGEL PI INDICATOR 8 (GLOVE) ×1
GLOVE ECLIPSE 8.5 STRL (GLOVE) ×1 IMPLANT
GLOVE ORTHO TXT STRL SZ7.5 (GLOVE) ×2 IMPLANT
GLOVE SURG SS PI 7.5 STRL IVOR (GLOVE) ×1 IMPLANT
GLOVE SURG SS PI 8.5 STRL IVOR (GLOVE) ×1
GLOVE SURG SS PI 8.5 STRL STRW (GLOVE) IMPLANT
GOWN PREVENTION PLUS XLARGE (GOWN DISPOSABLE) ×5 IMPLANT
GOWN STRL NON-REIN LRG LVL3 (GOWN DISPOSABLE) ×2 IMPLANT
GOWN STRL REIN 2XL XLG LVL4 (GOWN DISPOSABLE) ×2 IMPLANT
HANDPIECE INTERPULSE COAX TIP (DISPOSABLE) ×2
IMMOBILIZER KNEE 22 UNIV (SOFTGOODS) ×2 IMPLANT
IMMOBILIZER KNEE 24 THIGH 36 (MISCELLANEOUS) IMPLANT
IMMOBILIZER KNEE 24 UNIV (MISCELLANEOUS)
KIT BASIN OR (CUSTOM PROCEDURE TRAY) ×2 IMPLANT
KIT ROOM TURNOVER OR (KITS) ×2 IMPLANT
MANIFOLD NEPTUNE II (INSTRUMENTS) ×2 IMPLANT
NS IRRIG 1000ML POUR BTL (IV SOLUTION) ×2 IMPLANT
PACK TOTAL JOINT (CUSTOM PROCEDURE TRAY) ×2 IMPLANT
PAD ARMBOARD 7.5X6 YLW CONV (MISCELLANEOUS) ×3 IMPLANT
PAD CAST 4YDX4 CTTN HI CHSV (CAST SUPPLIES) ×1 IMPLANT
PADDING CAST COTTON 4X4 STRL (CAST SUPPLIES)
PADDING CAST COTTON 6X4 STRL (CAST SUPPLIES) ×1 IMPLANT
RUBBERBAND STERILE (MISCELLANEOUS) ×2 IMPLANT
SET HNDPC FAN SPRY TIP SCT (DISPOSABLE) ×1 IMPLANT
SPONGE GAUZE 4X4 12PLY (GAUZE/BANDAGES/DRESSINGS) ×1 IMPLANT
STAPLER VISISTAT 35W (STAPLE) ×2 IMPLANT
SUCTION FRAZIER TIP 10 FR DISP (SUCTIONS) ×2 IMPLANT
SUT VIC AB 1 CTX 36 (SUTURE) ×4
SUT VIC AB 1 CTX36XBRD ANBCTR (SUTURE) ×2 IMPLANT
SUT VIC AB 2-0 CT1 27 (SUTURE) ×4
SUT VIC AB 2-0 CT1 TAPERPNT 27 (SUTURE) ×2 IMPLANT
SYR 30ML LL (SYRINGE) ×2 IMPLANT
SYR 30ML SLIP (SYRINGE) ×1 IMPLANT
TOWEL OR 17X24 6PK STRL BLUE (TOWEL DISPOSABLE) ×2 IMPLANT
TOWEL OR 17X26 10 PK STRL BLUE (TOWEL DISPOSABLE) ×2 IMPLANT
TRAY FOLEY CATH 14FR (SET/KITS/TRAYS/PACK) ×2 IMPLANT
WATER STERILE IRR 1000ML POUR (IV SOLUTION) ×4 IMPLANT

## 2011-11-30 NOTE — Anesthesia Postprocedure Evaluation (Signed)
  Anesthesia Post-op Note  Patient: Kirsten Horton  Procedure(s) Performed: Procedure(s) (LRB): TOTAL KNEE ARTHROPLASTY (Left)  Patient Location: PACU  Anesthesia Type: GA combined with regional for post-op pain  Level of Consciousness: awake  Airway and Oxygen Therapy: Patient Spontanous Breathing and Patient connected to nasal cannula oxygen  Post-op Pain: mild  Post-op Assessment: Post-op Vital signs reviewed, Patient's Cardiovascular Status Stable, Respiratory Function Stable, Patent Airway and No signs of Nausea or vomiting  Post-op Vital Signs: Reviewed and stable  Complications: No apparent anesthesia complications

## 2011-11-30 NOTE — Progress Notes (Signed)
ANTICOAGULATION CONSULT NOTE - Initial Consult  Pharmacy Consult for Coumadin Indication: VTE prophylaxis s/p L TKA  Allergies  Allergen Reactions  . Penicillins Hives and Rash    Patient Measurements: Height: 5' (152.4 cm) Weight: 183 lb 12.8 oz (83.371 kg) IBW/kg (Calculated) : 45.5   Vital Signs: Temp: 97.6 F (36.4 C) (04/24 1300) Temp src: Oral (04/24 0718) BP: 146/64 mmHg (04/24 1300) Pulse Rate: 67  (04/24 1300)  Labs: 4/16 INR 1.08 No results found for this basename: HGB:2,HCT:3,PLT:3,APTT:3,LABPROT:3,INR:3,HEPARINUNFRC:3,CREATININE:3,CKTOTAL:3,CKMB:3,TROPONINI:3 in the last 72 hours Estimated Creatinine Clearance: 61.8 ml/min (by C-G formula based on Cr of 0.63).  Medical History: Past Medical History  Diagnosis Date  . Asthma     seasonal  . Arthritis     Medications:  Prescriptions prior to admission  Medication Sig Dispense Refill  . albuterol (PROVENTIL HFA;VENTOLIN HFA) 108 (90 BASE) MCG/ACT inhaler Inhale 2 puffs into the lungs every 6 (six) hours as needed. For shortness of breath      . calcium-vitamin D (OSCAL WITH D) 500-200 MG-UNIT per tablet Take 1 tablet by mouth daily.      . cholecalciferol (VITAMIN D) 1000 UNITS tablet Take 5,000 Units by mouth daily.      . Fluticasone-Salmeterol (ADVAIR) 250-50 MCG/DOSE AEPB Inhale 1 puff into the lungs 2 (two) times daily.      . naproxen (NAPROSYN) 375 MG tablet Take 375 mg by mouth 2 (two) times daily with a meal.      . pregabalin (LYRICA) 75 MG capsule Take 75 mg by mouth 2 (two) times daily.      . raloxifene (EVISTA) 60 MG tablet Take 60 mg by mouth daily.      . traMADol (ULTRAM) 50 MG tablet Take 50 mg by mouth 2 (two) times daily. For pain        Assessment: 71 yo female to begin Coumadin for VTE prophylaxis s/p L TKA. INR is normal at baseline.  Coumadin score = 4  Goal of Therapy:  INR 2-3   Plan:  1. Coumadin 7.5 mg po tonight 2. INR daily 3. Coumadin book and video  Lovell Sheehan 11/30/2011,1:44 PM

## 2011-11-30 NOTE — Transfer of Care (Signed)
Immediate Anesthesia Transfer of Care Note  Patient: Kirsten Horton  Procedure(s) Performed: Procedure(s) (LRB): TOTAL KNEE ARTHROPLASTY (Left)  Patient Location: PACU  Anesthesia Type: General and Regional  Level of Consciousness: awake, alert , oriented and sedated  Airway & Oxygen Therapy: Patient Spontanous Breathing and Patient connected to nasal cannula oxygen  Post-op Assessment: Report given to PACU RN, Post -op Vital signs reviewed and stable and Patient moving all extremities   Post vital signs: Reviewed  Complications: No apparent anesthesia complications

## 2011-11-30 NOTE — Interval H&P Note (Signed)
History and Physical Interval Note:  11/30/2011 8:02 AM  Kirsten Horton  has presented today for surgery, with the diagnosis of TOTAL LEFT KNEE REPLACEMENT  The various methods of treatment have been discussed with the patient and family. After consideration of risks, benefits and other options for treatment, the patient has consented to  Procedure(s) (LRB): TOTAL KNEE ARTHROPLASTY (Left) as a surgical intervention .  The patients' history has been reviewed, patient examined, no change in status, stable for surgery.  I have reviewed the patients' chart and labs.  Questions were answered to the patient's satisfaction.     Lebron Nauert F

## 2011-11-30 NOTE — Anesthesia Preprocedure Evaluation (Signed)
Anesthesia Evaluation  Patient identified by MRN, date of birth, ID band Patient awake    Reviewed: Allergy & Precautions, H&P , NPO status , Patient's Chart, lab work & pertinent test results  Airway Mallampati: II TM Distance: >3 FB Neck ROM: Full    Dental No notable dental hx. (+) Teeth Intact and Dental Advisory Given   Pulmonary asthma ,  breath sounds clear to auscultation  Pulmonary exam normal       Cardiovascular negative cardio ROS  Rhythm:Regular Rate:Normal     Neuro/Psych negative neurological ROS  negative psych ROS   GI/Hepatic negative GI ROS, Neg liver ROS,   Endo/Other  negative endocrine ROS  Renal/GU negative Renal ROS  negative genitourinary   Musculoskeletal   Abdominal   Peds  Hematology negative hematology ROS (+)   Anesthesia Other Findings   Reproductive/Obstetrics negative OB ROS                           Anesthesia Physical Anesthesia Plan  ASA: II  Anesthesia Plan: General   Post-op Pain Management:    Induction: Intravenous  Airway Management Planned: LMA  Additional Equipment:   Intra-op Plan:   Post-operative Plan: Extubation in OR  Informed Consent: I have reviewed the patients History and Physical, chart, labs and discussed the procedure including the risks, benefits and alternatives for the proposed anesthesia with the patient or authorized representative who has indicated his/her understanding and acceptance.   Dental advisory given  Plan Discussed with: CRNA  Anesthesia Plan Comments:         Anesthesia Quick Evaluation  

## 2011-11-30 NOTE — Progress Notes (Signed)
Orthopedic Tech Progress Note Patient Details:  Kirsten Horton [facility] 1940-08-17 [id number] Trapeze bar     Cammer, Mickie Bail 11/30/2011, 1:53 PM

## 2011-11-30 NOTE — Anesthesia Procedure Notes (Signed)
Anesthesia Regional Block:  Femoral nerve block  Pre-Anesthetic Checklist: ,, timeout performed, Correct Patient, Correct Site, Correct Laterality, Correct Procedure, Correct Position, site marked, Risks and benefits discussed, pre-op evaluation,  At surgeon's request and post-op pain management  Laterality: Left  Prep: Maximum Sterile Barrier Precautions used and chloraprep       Needles:  Injection technique: Single-shot  Needle Type: Other   (Arrow 50mm)    Needle Gauge: 22 and 22 G    Additional Needles:  Procedures: nerve stimulator Femoral nerve block  Nerve Stimulator or Paresthesia:  Response: Patellar respose, 0.44 mA,   Additional Responses:   Narrative:  Start time: 11/30/2011 8:10 AM End time: 11/30/2011 8:19 AM Injection made incrementally with aspirations every 5 mL. Anesthesiologist: Farheen Pfahler,MD  Additional Notes: 2% Lidocaine skin wheel.   Femoral nerve block

## 2011-11-30 NOTE — Preoperative (Signed)
Beta Blockers   Reason not to administer Beta Blockers:Not Applicable 

## 2011-11-30 NOTE — Progress Notes (Signed)
Pt is s/p L TKR. Knee precautions. +CMS. Pt is to be WBAT with RW and KI per order. Pt admitted to unit in CPM 0-60 with hemovac clamped per order. Dressing to L knee is dry and intact. Ice to L knee. Lungs CTA but diminished in the bases. Heart rate regular rate and rhythm. No s/sx cardiac or resp distress and no c/o such. Vital signs are stable. Pt is alert and oriented x 3. Pt is lethargic but very arousable due to meds in PACU. Pt denies pain. Pt has a foley draining mod amount of clear yellow urine. Abdomen is soft flat nontender and nondistended. BS hypoactive x4. Pt denies passing gas and denies nausea or vomiting. Pt repts LBM 4/23. Pt oriented to unit protocols, fall precautions, pain regimen as per MD order, IS usage. Pt verb understanding and agrees to comply. Family at bedside and supportive.

## 2011-11-30 NOTE — Brief Op Note (Signed)
11/30/2011  10:54 AM  PATIENT:  Kirsten Horton  71 y.o. female  PRE-OPERATIVE DIAGNOSIS:   DEGENERATIVE JOINT DISEASE LEFT KNEE   POST-OPERATIVE DIAGNOSIS:   DEGENERATIVE JOINT DISEASE LEFT KNEE   PROCEDURE:  Procedure(s) (LRB): TOTAL KNEE ARTHROPLASTY (Left)  SURGEON:  Surgeon(s) and Role:    * Loreta Ave, MD - Primary  PHYSICIAN ASSISTANT: Zonia Kief M    ANESTHESIA:   general  EBL:  Total I/O In: 1000 [I.V.:1000] Out: 200 [Urine:200]  BLOOD ADMINISTERED:none  SPECIMEN:  No Specimen  DISPOSITION OF SPECIMEN:  N/A  COUNTS:  YES  TOURNIQUET:   Total Tourniquet Time Documented: Thigh (Left) - 84 minutes PATIENT DISPOSITION:  PACU - hemodynamically stable.

## 2011-11-30 NOTE — Progress Notes (Signed)
Orthopedic Tech Progress Note Patient Details:  Kirsten Horton [facility] 13-Sep-1940 [id number]  CPM Left Knee CPM Left Knee: On Left Knee Flexion (Degrees): 60  Left Knee Extension (Degrees): 0  Additional Comments: trapeze bar   Cammer, Mickie Bail 11/30/2011, 1:53 PM

## 2011-12-01 ENCOUNTER — Encounter (HOSPITAL_COMMUNITY): Payer: Self-pay | Admitting: *Deleted

## 2011-12-01 LAB — CBC
HCT: 27 % — ABNORMAL LOW (ref 36.0–46.0)
MCHC: 33 g/dL (ref 30.0–36.0)
RDW: 12.7 % (ref 11.5–15.5)
WBC: 8 10*3/uL (ref 4.0–10.5)

## 2011-12-01 LAB — BASIC METABOLIC PANEL
BUN: 10 mg/dL (ref 6–23)
Chloride: 106 mEq/L (ref 96–112)
GFR calc Af Amer: >90 mL/min (ref >90)
GFR calc non Af Amer: 84 mL/min — ABNORMAL LOW (ref >90)
Potassium: 4.1 mEq/L (ref 3.5–5.1)
Sodium: 140 mEq/L (ref 135–145)

## 2011-12-01 LAB — PROTIME-INR
INR: 1.2 (ref 0.00–1.49)
Prothrombin Time: 15.5 seconds — ABNORMAL HIGH (ref 11.6–15.2)

## 2011-12-01 LAB — HEMOGLOBIN AND HEMATOCRIT, BLOOD
HCT: 26.9 % — ABNORMAL LOW (ref 36.0–46.0)
Hemoglobin: 8.8 g/dL — ABNORMAL LOW (ref 12.0–15.0)

## 2011-12-01 MED ORDER — FLUTICASONE-SALMETEROL 250-50 MCG/DOSE IN AEPB
1.0000 | INHALATION_SPRAY | Freq: Two times a day (BID) | RESPIRATORY_TRACT | Status: DC
  Administered 2011-12-02 – 2011-12-03 (×3): 1 via RESPIRATORY_TRACT
  Filled 2011-12-01: qty 14

## 2011-12-01 MED ORDER — WARFARIN SODIUM 7.5 MG PO TABS
7.5000 mg | ORAL_TABLET | Freq: Once | ORAL | Status: AC
  Administered 2011-12-01: 7.5 mg via ORAL
  Filled 2011-12-01: qty 1

## 2011-12-01 MED FILL — Morphine Sulfate Inj 4 MG/ML: INTRAMUSCULAR | Qty: 1 | Status: AC

## 2011-12-01 NOTE — Progress Notes (Signed)
Physical Therapy Evaluation Note  Past Medical History  Diagnosis Date  . Asthma     seasonal  . Arthritis     Past Surgical History  Procedure Date  . Abdominal hysterectomy      12/01/11 0805  PT Visit Information  Last PT Received On 12/01/11  Assistance Needed +1  PT Time Calculation  PT Start Time 0805  PT Stop Time 0832  PT Time Calculation (min) 27 min  Subjective Data  Subjective Pt received supine in bed with c/o 6/10 L knee pain.  Precautions  Precautions Knee  Required Braces or Orthoses Knee Immobilizer - Left  Knee Immobilizer - Left On when out of bed or walking  Restrictions  LLE Weight Bearing WBAT  Home Living  Lives With Family (niece and granddaughters)  Available Help at Discharge Family;Available 24 hours/day  Type of Home House  Home Access Stairs to enter  Entrance Stairs-Number of Steps 2  Entrance Stairs-Rails Can reach both  Home Layout One level  Bathroom Shower/Tub Tub/shower unit;Curtain  Bathroom Toilet Handicapped height  Bathroom Accessibility Yes  How Accessible Accessible via walker  Home Adaptive Equipment Walker - rolling;Grab bars around toilet;Tub transfer bench  Prior Function  Level of Independence Independent  Able to Take Stairs? 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 difficulties  Cognition  Overall Cognitive Status Appears within functional limits for tasks assessed/performed  Arousal/Alertness Awake/alert  Orientation Level Appears intact for tasks assessed  Behavior During Session [facility] for tasks performed  Right Upper Extremity Assessment  RUE ROM/Strength/Tone WFL  Left Upper Extremity Assessment  LUE ROM/Strength/Tone WFL  Right Lower Extremity Assessment  RLE ROM/Strength/Tone WFL  Left Lower Extremity Assessment  LLE ROM/Strength/Tone (pt able to initiate quad set, L active knee flex 25 deg)  Trunk Assessment  Trunk Assessment Normal    Bed Mobility  Bed Mobility Supine to Sit  Supine to Sit 4: Min assist;With rails;HOB flat  Details for Bed Mobility Assistance assist at trunk initially due to no hand rails,  Transfers  Transfers Sit to Stand;Stand to Sit  Sit to Stand 4: Min assist;With upper extremity assist;From bed  Stand to Sit 4: Min assist;To chair/3-in-1;With armrests  Details for Transfer Assistance v/c's for hand placement and L LE management  Ambulation/Gait  Ambulation/Gait Assistance 4: Min assist  Ambulation Distance (Feet) 30 Feet  Assistive device Rolling walker  Ambulation/Gait Assistance Details v/c's initially for walker management but then patient with good sequencing  Gait Pattern Step-to pattern;Decreased step length - left;Decreased stance time - left;Antalgic  Exercises  Exercises Total Joint  Total Joint Exercises  Ankle Circles/Pumps AROM;10 reps;Supine;Both  Quad Sets AROM;Left;10 reps;Supine  Knee Flexion Left;10 reps;Seated;AAROM  PT - End of Session  Equipment Utilized During Treatment Gait belt  Activity Tolerance Patient limited by pain  Patient left in chair;with call bell/phone within reach;with family/visitor present  Nurse Communication Mobility status  CPM Left Knee  CPM Left Knee Off  PT Assessment  Clinical Impression Statement Pt s/p L TKA presenting with decreased L LE strength, knee rom, and requires increased assist for all mobiltiy and ADLs Patient to require 24/7 assist/supervision upon d/c. Pt reports family to be avail 24/7. Patient motivated and did very well this date. Patient to benefit from home health PT to maximize L LE recovery to transition to I function.  PT Recommendation/Assessment Patient needs continued PT services  PT Problem List Decreased strength;Decreased range of motion;Decreased  activity tolerance  Barriers to Discharge None  PT Therapy Diagnosis  Difficulty walking;Abnormality of gait;Generalized weakness;Acute pain  PT Plan  PT Frequency  7X/week  PT Treatment/Interventions DME instruction;Gait training;Stair training;Therapeutic activities;Therapeutic exercise;Functional mobility training  PT Recommendation  Follow Up Recommendations Home health PT;Supervision/Assistance - 24 hour  Equipment Recommended None recommended by PT (pt with all DME recommended)  Individuals Consulted  Consulted and Agree with Results and Recommendations Patient  Acute Rehab PT Goals  PT Goal Formulation With patient  Time For Goal Achievement 12/08/11  Potential to Achieve Goals Good  Pt will go Sit to Stand with modified independence  PT Goal: Sit to Stand - Progress Goal set today  Pt will Ambulate >150 feet;with modified independence;with rolling walker  PT Goal: Ambulate - Progress Goal set today  Pt will Go Up / Down Stairs 1-2 stairs;with rail(s)  PT Goal: Up/Down Stairs - Progress Goal set today  Pt will Perform Home Exercise Program Independently  PT Goal: Perform Home Exercise Program - Progress Goal set today  Written Expression  Dominant Hand Right    Pain: 6/10 L knee pain  Lewis Shock, PT, DPT Pager #: [phone] Office #: [phone]

## 2011-12-01 NOTE — Evaluation (Signed)
Occupational Therapy Evaluation Patient Details Name: Kirsten Horton MRN: [id number] DOB: Feb 01, 1941 Today's Date: 12/01/2011 Time: 3244-0102 OT Time Calculation (min): 14 min  OT Assessment / Plan / Recommendation Clinical Impression  This 71 y.o. female admitted for TKA.  Pt. is moving very well, and has very good family support.  She demonstrates the below listed deficits and will benefit from OT to maximize safety and independence with BADLs to allow her to return home with family and supervision and min A    OT Assessment  Patient needs continued OT Services    Follow Up Recommendations  No OT follow up;Supervision - Intermittent    Equipment Recommendations  None recommended by OT    Frequency Min 2X/week    Precautions / Restrictions Precautions Precautions: Knee Required Braces or Orthoses: Knee Immobilizer - Left Knee Immobilizer - Left: On when out of bed or walking Restrictions Weight Bearing Restrictions: Yes LLE Weight Bearing: Weight bearing as tolerated       ADL  Eating/Feeding: Performed;Independent Where Assessed - Eating/Feeding: Chair Grooming: Simulated;Wash/dry hands;Wash/dry face (min guard assist) Where Assessed - Grooming: Standing at sink Upper Body Bathing: Simulated;Set up Where Assessed - Upper Body Bathing: Sitting, chair Lower Body Bathing: Simulated;Minimal assistance Where Assessed - Lower Body Bathing: Sit to stand from chair Upper Body Dressing: Simulated;Set up Where Assessed - Upper Body Dressing: Sitting, chair Lower Body Dressing: Simulated;Moderate assistance Where Assessed - Lower Body Dressing: Sit to stand from chair Toilet Transfer: Performed (min guard assist) Toilet Transfer Method: Ambulating Toilet Transfer Equipment: Comfort height toilet;Grab bars (pt. is 5'1") Toileting - Clothing Manipulation: Performed;Minimal assistance Where Assessed - Toileting Clothing Manipulation: Sit to stand from 3-in-1 or  toilet Toileting - Hygiene: Performed;Modified independent Where Assessed - Toileting Hygiene: Sit on 3-in-1 or toilet Equipment Used: Knee Immobilizer;Rolling walker Ambulation Related to ADLs: Pt. ambulates with min gaurd assist ADL Comments: Pt. able to reach to ankle of Lt. LE.  Unable to access foot, but anticipate she will progress quickly.  Pt. will have assistance as necessary    OT Goals Acute Rehab OT Goals OT Goal Formulation: With patient Time For Goal Achievement: 12/08/11 Potential to Achieve Goals: Good ADL Goals Pt Will Perform Grooming: with modified independence;Standing at sink ADL Goal: Grooming - Progress: Goal set today Pt Will Perform Lower Body Bathing: with modified independence;Sit to stand from chair ADL Goal: Lower Body Bathing - Progress: Goal set today Pt Will Perform Lower Body Dressing: with modified independence;Sit to stand from chair ADL Goal: Lower Body Dressing - Progress: Goal set today Pt Will Transfer to Toilet: with modified independence;Ambulation;Comfort height toilet ADL Goal: Toilet Transfer - Progress: Goal set today Pt Will Perform Toileting - Clothing Manipulation: with modified independence;Standing ADL Goal: Toileting - Clothing Manipulation - Progress: Goal set today Pt Will Perform Tub/Shower Transfer: Tub transfer;with supervision;Ambulation;Transfer tub bench ADL Goal: Tub/Shower Transfer - Progress: Goal set today  Visit Information  Last OT Received On: 12/01/11 Assistance Needed: +1    Subjective Data  Subjective: "I need to go to the bathroom Patient Stated Goal: To go home   Prior Functioning  Home Living Lives With: Family Available Help at Discharge: Family;Available 24 hours/day Type of Home: House Home Access: Stairs to enter [employer] of Steps: 2 Entrance Stairs-Rails: Can reach both Home Layout: One level Bathroom Shower/Tub: Tub/shower unit;Curtain Bathroom Toilet: Handicapped height Bathroom  Accessibility: Yes How Accessible: Accessible via walker Home Adaptive Equipment: Walker - rolling;Grab bars around toilet;Tub transfer bench Prior  Function Level of Independence: Independent Able to Take Stairs?: Yes Driving: Yes Vocation: Retired Musician: No difficulties Dominant Hand: Right    Cognition  Overall Cognitive Status: Appears within functional limits for tasks assessed/performed Arousal/Alertness: Awake/alert Orientation Level: Appears intact for tasks assessed Behavior During Session: [facility] for tasks performed    Extremity/Trunk Assessment Right Upper Extremity Assessment RUE ROM/Strength/Tone: Within functional levels RUE Sensation: WFL - Light Touch RUE Coordination: WFL - gross/fine motor Left Upper Extremity Assessment LUE ROM/Strength/Tone: Within functional levels LUE Sensation: WFL - Light Touch LUE Coordination: WFL - gross/fine motor   Mobility Transfers Transfers: Sit to Stand;Stand to Sit Sit to Stand: 4: Min guard;With upper extremity assist;From chair/3-in-1;From toilet Stand to Sit: 4: Min guard;With upper extremity assist;To chair/3-in-1;To toilet   Exercise    Balance    End of Session OT - End of Session Equipment Utilized During Treatment: Left knee immobilizer Activity Tolerance: Patient limited by pain Patient left: in chair;with call bell/phone within reach;with family/visitor present   Lema Heinkel, Ursula Alert M 12/01/2011, 1:02 PM

## 2011-12-01 NOTE — Op Note (Signed)
Kirsten Horton, Kirsten Horton            ACCOUNT NO.:  [id number]  MEDICAL RECORD NO.:  [id number]  LOCATION:  5041                         FACILITY:  [facility]  PHYSICIAN:  Loreta Ave, M.D. 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  PROCEDURE:  Modified minimally invasive left total knee replacement with Stryker triathlon prosthesis.  Soft tissue balancing.  A cemented pegged posterior stabilized #3 femoral component.  Cemented #4 tibial component with a 19-mm polyethylene insert.  Cemented resurfacing 32-mm patellar component.  SURGEON:  Loreta Ave, MD  ASSISTANT:  Genene Churn. Barry Dienes, [place], present throughout the entire case and necessary for timely completion of the procedure.  ANESTHESIA:  General.  BLOOD LOSS:  Minimal.  SPECIMENS:  None.  CULTURES:  None.  COMPLICATION:  None.  DRESSINGS:  Soft compressive with knee immobilizer.  DRAINS:  Hemovac x1.  TOURNIQUET TIME:  1 hour.  PROCEDURE:  The patient was brought to the operating room, placed on the operating table in supine position.  After adequate general anesthesia had been obtained, tourniquet applied, prepped and draped in usual sterile fashion.  Exsanguinated with elevation, Esmarch tourniquet inflated to 350 mmHg.  Knee examined.  She had a fair amount of varus valgus excursion from bone loss.  Still full extension about 90 degrees of flexion.  Anterior approach.  Hemostasis cautery.  Vastus splitting preserving quad tendon.  Knee exposed.  Exuberant, extensive  spurring throughout removed.  Remnants of menisci, cruciate ligaments, loose bodies all removed.  Distal femur exposed.  Intramedullary guide placed. 8 mm resection of distal femur, 5 degrees of valgus.  Using epicondylar axis, the femur was sized, cut, and fitted for a posterior stabilized pegged #3 component.  Proximal tibial resection just at the level of the defect medially.  This caused a fair amount of bony resection laterally to get a good flat cut perpendicular to the shaft.  The sclerotic bone still present on the medial side, treated with multiple drilling.  Size #4 component.  Patella exposed, loose body and spurs removed.  Drill, sized, fitted for a 32-mm component.  Trials put in place throughout.  I ended up having using 19 mm insert, which is almost 10 mm thick than usual insert in order to get a balanced knee.  Nicely balanced in flexion and extension.  Full extension, full flexion, good by mechanical axis, good patellofemoral tracking.  Tibia was marked for rotation and reamed.  All trials removed.  Copious irrigation with pulse irrigating device.  Cement prepared and placed on all components, firmly seated. Polyethylene attached to tibia, knee reduced.  Patella held with clamp. Once cement hardened, the knee was reexamined.  Again, I pleased with balance in flexion and extension biomechanical axis, patellofemoral tracking.  Hemovac placed through a separate stab wound.  Wound had been irrigated with pulse lavage before  and after cementing.  Arthrotomy closed with #1 Vicryl.  Skin and subcutaneous tissue with Vicryl staples.  Sterile compressive dressing applied.  Tourniquet deflated and removed.  Knee immobilizer applied.  Anesthesia reversed.  Brought to the recovery room.  Tolerated surgery well.  There were no complications.     Loreta Ave, M.D.     DFM/MEDQ  D:  12/01/2011  T:  12/01/2011  Job:  [id number]

## 2011-12-01 NOTE — Progress Notes (Signed)
Subjective: Doing well.  No complaints  Objective: Vital signs in last 24 hours: Temp:  [97 F (36.1 C)-98.5 F (36.9 C)] 98.1 F (36.7 C) (04/25 0547) Pulse Rate:  [62-88] 85  (04/25 0547) Resp:  [9-18] 16  (04/25 0547) BP: (119-176)/(46-66) 119/46 mmHg (04/25 0547) SpO2:  [99 %-100 %] 100 % (04/25 1119) Weight:  [83.371 kg (183 lb 12.8 oz)] 83.371 kg (183 lb 12.8 oz) (04/24 1344)  Intake/Output from previous day: 04/24 0701 - 04/25 0700 In: 3430 [I.V.:3430] Out: 3065 [Urine:2900; Drains:165] Intake/Output this shift:     Basename 12/01/11 0640  HGB 8.9*    Basename 12/01/11 0640  WBC 8.0  RBC 2.78*  HCT 27.0*  PLT 196    Basename 12/01/11 0640  NA 140  K 4.1  CL 106  CO2 26  BUN 10  CREATININE 0.74  GLUCOSE 134*  CALCIUM 8.4    Basename 12/01/11 0640  LABPT --  INR 1.20    Exam:  Slight bleeding through dressing.  Calf nt, nvi.    Assessment/Plan: ABLA.  Recheck h/h today.  May need transfusion 2 units prbc's.   D/c dilaudid.   posible d/c home fri or sat.    Jacquelinne Speak M 12/01/2011, 11:29 AM

## 2011-12-01 NOTE — Consult Note (Signed)
ANTICOAGULATION CONSULT NOTE - Follow Up Consult  Pharmacy Consult for : Coumadin Indication: VTE prophylaxis following L-TKA  Allergies  Allergen Reactions  . Penicillins Hives and Rash    Patient Measurements: Height: 5' (152.4 cm) Weight: 183 lb 12.8 oz (83.371 kg) IBW/kg (Calculated) : 45.5    Vital Signs: Temp: 98.1 F (36.7 C) (04/25 0547) BP: 119/46 mmHg (04/25 0547) Pulse Rate: 85  (04/25 0547)  Labs:  Basename 12/01/11 0640  HGB 8.9*  HCT 27.0*  PLT 196  APTT --  LABPROT 15.5*  INR 1.20  HEPARINUNFRC --  CREATININE 0.74  CKTOTAL --  CKMB --  TROPONINI --   Estimated Creatinine Clearance: 61.8 ml/min (by C-G formula based on Cr of 0.74).   Medications:    coumadin book  Does not apply Once  docusate sodium 100 mg Oral BID  enoxaparin 30 mg Subcutaneous Q12H  Fluticasone-Salmeterol 1 puff Inhalation BID  pneumococcal 23 valent vaccine 0.5 mL Intramuscular Tomorrow-1000  pregabalin 75 mg Oral BID  raloxifene 60 mg Oral Daily  vancomycin 1,000 mg Intravenous Q12H  warfarin 7.5 mg Oral ONCE-1800  warfarin  Does not apply Once  Warfarin - Pharmacist Dosing Inpatient  Does not apply q1800     Assessment:  POD # 1 s/p L-TKA now on Coumadin for VTE prophylaxis.  INR  1.2.  WBC/Hgb/Hct/Plts:  8.0/8.9/27.0/196 (04/25 0640)  Slight bleeding through dressing.  Goal of Therapy:   INR 2-3   Plan:   Repeat Coumadin 7.5 mg po today. Daily INR's, CBC. Warf ED initiated  Laurena Bering, Pharm.D. 12/01/2011 12:54 PM

## 2011-12-01 NOTE — Progress Notes (Signed)
Physical Therapy Treatment Note   12/01/11 1549  PT Visit Information  Last PT Received On 12/01/11  Assistance Needed +1  PT Time Calculation  PT Start Time 1549  PT Stop Time 1606  PT Time Calculation (min) 17 min  Subjective Data  Subjective Pt received supine in bed on CPM. Pt reports being on it for 2 hours.  Precautions  Precautions Knee  Required Braces or Orthoses Knee Immobilizer - Left  Knee Immobilizer - Left On when out of bed or walking  Restrictions  LLE Weight Bearing WBAT  Cognition  Overall Cognitive Status Appears within functional limits for tasks assessed/performed  Arousal/Alertness Awake/alert  Orientation Level Appears intact for tasks assessed  Behavior During Session [facility] for tasks performed  Bed Mobility  Bed Mobility Supine to Sit  Supine to Sit HOB flat;With rails;4: Min assist  Details for Bed Mobility Assistance assist for L LE  Transfers  Transfers Sit to Stand;Stand to Sit  Sit to Stand 4: Min guard;With upper extremity assist;From chair/3-in-1;From toilet  Stand to Sit 4: Min guard;With upper extremity assist;To chair/3-in-1;To toilet  Details for Transfer Assistance pt with good L LE management and good control of descent  Ambulation/Gait  Ambulation/Gait Assistance 4: Min guard  Ambulation Distance (Feet) 60 Feet  Assistive device Rolling walker  Ambulation/Gait Assistance Details Patient with improved sequencing  Gait Pattern Step-to pattern;Decreased step length - left;Decreased stance time - left;Antalgic  Gait velocity slow but improved from this AM  Stairs No  Total Joint Exercises  Ankle Circles/Pumps AROM;10 reps;Supine;Both  Quad Sets AROM;Left;10 reps;Supine  Knee Flexion Left;10 reps;Seated;AAROM  Heel Slides AROM;Left;10 reps;Seated  Long Arc Quad AROM;10 reps;Left;Seated (able to initiate first 1/4 of ROM)  PT - End of Session  Equipment Utilized During Treatment Gait belt;Left knee immobilizer  Activity Tolerance Patient  limited by pain  Patient left in chair;with call bell/phone within reach;with family/visitor present  Nurse Communication Mobility status  PT - Assessment/Plan  Comments on Treatment Session Pt demonstrates improved ambulation endurance and active L knee ROM. Patient remains to have increased R knee pain with mvmt however is progressing towards all goals.  PT Plan Discharge plan remains appropriate;Frequency remains appropriate  PT Frequency 7X/week  Follow Up Recommendations Home health PT;Supervision/Assistance - 24 hour  Acute Rehab PT Goals  Time For Goal Achievement 12/08/11  Potential to Achieve Goals Good  Pt will go Supine/Side to Sit with modified independence;with HOB 0 degrees  PT Goal: Supine/Side to Sit - Progress Goal set today  PT Goal: Sit to Stand - Progress Progressing toward goal  PT Goal: Ambulate - Progress Progressing toward goal  PT Goal: Up/Down Stairs - Progress Not met  Pt will Perform Home Exercise Program Independently  PT Goal: Perform Home Exercise Program - Progress Progressing toward goal    Pain: 7/10 L knee pain with mvmt, RN aware  Lewis Shock, PT, DPT Pager #: [phone] Office #: [phone]

## 2011-12-02 ENCOUNTER — Inpatient Hospital Stay (HOSPITAL_COMMUNITY): Payer: Medicare Other

## 2011-12-02 LAB — CBC
HCT: 26.8 % — ABNORMAL LOW (ref 36.0–46.0)
MCH: 31.8 pg (ref 26.0–34.0)
MCHC: 32.8 g/dL (ref 30.0–36.0)
MCV: 96.8 fL (ref 78.0–100.0)
Platelets: 198 10*3/uL (ref 150–400)
RDW: 12.8 % (ref 11.5–15.5)
WBC: 10.8 10*3/uL — ABNORMAL HIGH (ref 4.0–10.5)

## 2011-12-02 LAB — BASIC METABOLIC PANEL
BUN: 7 mg/dL (ref 6–23)
Calcium: 8.4 mg/dL (ref 8.4–10.5)
Creatinine, Ser: 0.72 mg/dL (ref 0.50–1.10)
GFR calc Af Amer: >90 mL/min (ref >90)
GFR calc non Af Amer: 84 mL/min — ABNORMAL LOW (ref >90)

## 2011-12-02 LAB — URINALYSIS, ROUTINE W REFLEX MICROSCOPIC
Bilirubin Urine: NEGATIVE
Hgb urine dipstick: NEGATIVE
Ketones, ur: NEGATIVE mg/dL
Nitrite: NEGATIVE
Protein, ur: NEGATIVE mg/dL
Specific Gravity, Urine: 1.008 (ref 1.005–1.030)
Urobilinogen, UA: 0.2 mg/dL (ref 0.0–1.0)

## 2011-12-02 LAB — PROTIME-INR
INR: 1.61 — ABNORMAL HIGH (ref 0.00–1.49)
Prothrombin Time: 19.4 seconds — ABNORMAL HIGH (ref 11.6–15.2)

## 2011-12-02 LAB — PREPARE RBC (CROSSMATCH)

## 2011-12-02 MED ORDER — FUROSEMIDE 10 MG/ML IJ SOLN
20.0000 mg | Freq: Once | INTRAMUSCULAR | Status: AC
  Administered 2011-12-02: 20 mg via INTRAVENOUS
  Filled 2011-12-02: qty 2

## 2011-12-02 MED ORDER — BISACODYL 10 MG RE SUPP
10.0000 mg | Freq: Every day | RECTAL | Status: DC | PRN
Start: 2011-12-02 — End: 2011-12-03
  Filled 2011-12-02: qty 1

## 2011-12-02 MED ORDER — WARFARIN SODIUM 5 MG PO TABS
5.0000 mg | ORAL_TABLET | Freq: Once | ORAL | Status: AC
  Administered 2011-12-02: 5 mg via ORAL
  Filled 2011-12-02: qty 1

## 2011-12-02 NOTE — Progress Notes (Signed)
OT Cancellation Note  Treatment cancelled today due to fatigue after PT.  Will see tomorrow.  Jeani Hawking M 12/02/2011, 2:59 PM

## 2011-12-02 NOTE — Progress Notes (Signed)
Rept to Zonia Kief PA regarding pt's elevated temperatures. Pt temp after percocet and some incentive work and coughing and deep breathing is still 101.6. Orders received for UA, culture and portable CXR. Will continue to monitor.

## 2011-12-02 NOTE — Progress Notes (Signed)
Subjective: Pain controlled.  C/o feeling fatigued and weak when doing therapy yesterday.  No cp, sob.  Has not had bowel movement yet   Objective: Vital signs in last 24 hours: Temp:  [98.7 F (37.1 C)-100.6 F (38.1 C)] 99.8 F (37.7 C) (04/26 0514) Pulse Rate:  [85-113] 113  (04/26 0514) Resp:  [16-18] 18  (04/26 0514) BP: (130-164)/(45-49) 130/45 mmHg (04/26 0514) SpO2:  [91 %-100 %] 91 % (04/26 0514)  Intake/Output from previous day: 04/25 0701 - 04/26 0700 In: 255 [I.V.:255] Out: 280 [Urine:180; Drains:100] Intake/Output this shift:     Basename 12/02/11 0458 12/01/11 1342 12/01/11 0640  HGB 8.8* 8.8* 8.9*    Basename 12/02/11 0458 12/01/11 1342 12/01/11 0640  WBC 10.8* -- 8.0  RBC 2.77* -- 2.78*  HCT 26.8* 26.9* --  PLT 198 -- 196    Basename 12/02/11 0458 12/01/11 0640  NA 141 140  K 3.7 4.1  CL 104 106  CO2 27 26  BUN 7 10  CREATININE 0.72 0.74  GLUCOSE 120* 134*  CALCIUM 8.4 8.4    Basename 12/02/11 0458 12/01/11 0640  LABPT -- --  INR 1.61* 1.20    Neurologically intact Wound looks good.  Staples intact.  No drainage or signs of infection.  Calf nontender. hemovac removed.  Assessment/Plan: Symptomatic abla.  Transfuse 1 unit prbc's today.  Possible d/c home today or Saturday.   Dulcolax supp this AM.   Zonia Kief M 12/02/2011, 9:27 AM

## 2011-12-02 NOTE — Consult Note (Signed)
ANTICOAGULATION CONSULT NOTE - Follow Up Consult  Pharmacy Consult for : Coumadin with Lovenox bridging Indication: VTE prophylaxis following L-TKA  Allergies  Allergen Reactions  . Penicillins Hives and Rash    Patient Measurements: Height: 5' (152.4 cm) Weight: 183 lb 12.8 oz (83.371 kg) IBW/kg (Calculated) : 45.5    Vital Signs: Temp: 99 F (37.2 C) (04/26 1200) BP: 137/79 mmHg (04/26 1200) Pulse Rate: 98  (04/26 1200)  Labs:  Basename 12/02/11 0458 12/01/11 1342 12/01/11 0640  HGB 8.8* 8.8* --  HCT 26.8* 26.9* 27.0*  PLT 198 -- 196  APTT -- -- --  LABPROT 19.4* -- 15.5*  INR 1.61* -- 1.20  HEPARINUNFRC -- -- --  CREATININE 0.72 -- 0.74  CKTOTAL -- -- --  CKMB -- -- --  TROPONINI -- -- --   Estimated Creatinine Clearance: 61.8 ml/min (by C-G formula based on Cr of 0.72).   Medications:    coumadin book  Does not apply Once  docusate sodium 100 mg Oral BID  enoxaparin 30 mg Subcutaneous Q12H  Fluticasone-Salmeterol 1 puff Inhalation BID  furosemide 20 mg Intravenous Once  pregabalin 75 mg Oral BID  raloxifene 60 mg Oral Daily  warfarin 7.5 mg Oral ONCE-1800  warfarin  Does not apply Once  Warfarin - Pharmacist Dosing Inpatient  Does not apply q1800  DISCONTD: Fluticasone-Salmeterol 1 puff Inhalation BID   Assessment:  POD # 2 s/p L-TKA for patient receiving Coumadin with Lovenox bridging for VTE prophylaxis.  INR  1.2  >>  1.61.   WBC/Hgb/Hct/Plts:  10.8/8.8/26.8/198 (04/26 0458)  No significant bleeding noted.  Planned transfusion of 1 unit PRBC's today with possible discharge home.  Goal of Therapy:   INR 2-3   Plan:   Coumadin 5 mg po today. Daily INR's, CBC.  Ceanna Wareing, Elisha Headland, Pharm.D. 12/02/2011 1:02 PM

## 2011-12-02 NOTE — Progress Notes (Addendum)
PT Cancellation Note  Treatment cancelled today due to medical issues with patient which prohibited therapy. Per PA request no PT treatment this am until after she receives blood transfusion. Will treat this afternoon.   [name] 12/02/2011, 8:33 AM

## 2011-12-02 NOTE — Progress Notes (Signed)
**Note Kirsten-Identified via Obfuscation** Physical Therapy Treatment Patient Details Name: Kirsten Horton MRN: [id number] DOB: 08-15-1940 Today's Date: 12/02/2011 Time: 4098-1191 PT Time Calculation (min): 26 min  PT Assessment / Plan / Recommendation Comments on Treatment Session  Pt able to amb/stairs but limited due to fatigue. Pt regressed in activity compared to previous day (12/02/11) per report by OT in gym during stair training.  Pt still receiving blood during session.       Follow Up Recommendations  Home health PT    Equipment Recommendations  None recommended by PT;None recommended by OT    Frequency 7X/week   Plan Discharge plan remains appropriate;Frequency remains appropriate    Precautions / Restrictions Precautions Precautions: Knee Knee Immobilizer - Left: On when out of bed or walking Restrictions Weight Bearing Restrictions: Yes LLE Weight Bearing: Weight bearing as tolerated   Pertinent Vitals/Pain     Mobility  Transfers Transfers: Sit to Stand;Stand to Sit Sit to Stand: 5: Supervision;With upper extremity assist;With armrests;From chair/3-in-1 Stand to Sit: 5: Supervision;Without upper extremity assist;With armrests;To chair/3-in-1 Details for Transfer Assistance: Pt needed VC's for upright posture and to control descent to chair   Ambulation/Gait Ambulation/Gait Assistance: 4: Min guard Ambulation Distance (Feet): 60 Feet Assistive device: Rolling walker Ambulation/Gait Assistance Details: VC's for upright posture. Min guard due to pt fatigue.  Gait Pattern: Step-to pattern;Decreased stride length;Decreased stance time - left;Trunk flexed Stairs: Yes Stairs Assistance: 4: Min guard Stair Management Technique: Two rails;Step to pattern;Forwards Number of Stairs: 6  (3 up 3 down in gym 5th floor) Wheelchair Mobility Wheelchair Mobility: No    Exercises     PT Goals Acute Rehab PT Goals PT Goal: Sit to Stand - Progress: Progressing toward goal PT Goal: Ambulate - Progress:  Progressing toward goal PT Goal: Up/Down Stairs - Progress: Progressing toward goal  Visit Information  Last PT Received On: 12/02/11    Subjective Data      Cognition  Overall Cognitive Status: Appears within functional limits for tasks assessed/performed Arousal/Alertness: Awake/alert Orientation Level: Appears intact for tasks assessed Behavior During Session: [facility] for tasks performed    Balance     End of Session PT - End of Session Activity Tolerance: Patient limited by fatigue;Patient tolerated treatment well    Kirsten Horton 12/02/2011, 3:26 PM    Kirsten Horton, PTA [phone] 12/03/2011

## 2011-12-03 ENCOUNTER — Other Ambulatory Visit: Payer: Self-pay | Admitting: Orthopedic Surgery

## 2011-12-03 LAB — BASIC METABOLIC PANEL
Calcium: 8.4 mg/dL (ref 8.4–10.5)
Chloride: 102 mEq/L (ref 96–112)
Creatinine, Ser: 0.76 mg/dL (ref 0.50–1.10)
GFR calc Af Amer: >90 mL/min (ref >90)
Sodium: 140 mEq/L (ref 135–145)

## 2011-12-03 LAB — URINE CULTURE: Culture: NO GROWTH

## 2011-12-03 LAB — TYPE AND SCREEN: ABO/RH(D): A POS

## 2011-12-03 LAB — PROTIME-INR: Prothrombin Time: 18.9 seconds — ABNORMAL HIGH (ref 11.6–15.2)

## 2011-12-03 LAB — CBC
MCH: 32 pg (ref 26.0–34.0)
MCV: 95.6 fL (ref 78.0–100.0)
Platelets: 182 10*3/uL (ref 150–400)
RDW: 14.2 % (ref 11.5–15.5)
WBC: 9.7 10*3/uL (ref 4.0–10.5)

## 2011-12-03 MED ORDER — WARFARIN SODIUM 7.5 MG PO TABS
7.5000 mg | ORAL_TABLET | Freq: Once | ORAL | Status: DC
  Filled 2011-12-03: qty 1

## 2011-12-03 NOTE — Care Management (Signed)
12/03/2011 1530 Contacted [facility] to add [facility] RN for Coumadin Management. Faxed HH RN order. Isidoro Donning RN CCM Case Mgmt phon [phone]

## 2011-12-03 NOTE — Progress Notes (Signed)
Occupational Therapy Treatment Patient Details Name: Kirsten Horton MRN: [id number] DOB: Mar 30, 1941 Today's Date: 12/03/2011 Time: 4403-4742 OT Time Calculation (min): 11 min  OT Assessment / Plan / Recommendation Comments on Treatment Session Pt. demonstrates increasing independence with BADLs    Follow Up Recommendations  No OT follow up;Supervision - Intermittent    Equipment Recommendations  None recommended by PT;None recommended by OT    Frequency Min 2X/week   Plan Discharge plan remains appropriate    Precautions / Restrictions Precautions Precautions: Knee Required Braces or Orthoses: Knee Immobilizer - Left Knee Immobilizer - Left: On when out of bed or walking Restrictions Weight Bearing Restrictions: Yes LLE Weight Bearing: Weight bearing as tolerated       ADL  Lower Body Dressing: Simulated;Supervision/safety (able to access foot to don/doff sock) Where Assessed - Lower Body Dressing: Sit to stand from chair Tub/Shower Transfer: Minimal assistance;Performed Tub/Shower Transfer Method: Ambulating Tub/Shower Transfer Equipment: Counsellor Used: Knee Immobilizer;Rolling walker (tub transfer bench) Ambulation Related to ADLs: Pt. ambulates with supervision ADL Comments: Pt. instructed to perform LB ADLs instead of allowing family to assist.  Pt demonstrates good understanding of safety issues and use of DME    OT Goals ADL Goals ADL Goal: Lower Body Dressing - Progress: Progressing toward goals ADL Goal: Tub/Shower Transfer - Progress: Progressing toward goals  Visit Information  Last OT Received On: 12/03/11 Assistance Needed: +1 PT/OT Co-Evaluation/Treatment:  (Piggy backed with PT)    Subjective Data      Prior Functioning       Cognition  Overall Cognitive Status: Appears within functional limits for tasks assessed/performed Arousal/Alertness: Awake/alert Orientation Level: Appears intact for tasks assessed Behavior During  Session: [facility] for tasks performed    Mobility Transfers Transfers: Sit to Stand;Stand to Sit Sit to Stand: 5: Supervision;With upper extremity assist;With armrests;From chair/3-in-1 Stand to Sit: 5: Supervision;Without upper extremity assist;With armrests;To chair/3-in-1   Exercises    Balance    End of Session OT - End of Session Equipment Utilized During Treatment: Left knee immobilizer Activity Tolerance: Patient limited by pain Patient left:  (with PT)   Ailani Governale, Ursula Alert M 12/03/2011, 12:09 PM

## 2011-12-03 NOTE — Progress Notes (Signed)
D/C instructions reviewed with patient and daughter. RX x 1 given to pt. The rest has been called in to pts pharmacy. Has hh equipment at home. hh services arranged with [facility]. All questions answered. Pt d/c'ed via wheelchair in stable condition

## 2011-12-03 NOTE — Progress Notes (Signed)
Afebrile this am I was called last night because he temp was 102 with a negative CXR and urinalysis.  She is up with PT and ready to go this am.  Dr. Eulah Pont has arranged for her Rxs'.  Will Discharge.  Final Dx: OA left knee, s/p left TKR.

## 2011-12-03 NOTE — Consult Note (Addendum)
ANTICOAGULATION CONSULT NOTE - Follow Up Consult  Pharmacy Consult for : Coumadin with Lovenox bridging Indication: VTE prophylaxis following L-TKA  Allergies  Allergen Reactions  . Penicillins Hives and Rash    Patient Measurements: Height: 5' (152.4 cm) Weight: 183 lb 12.8 oz (83.371 kg) IBW/kg (Calculated) : 45.5    Vital Signs: Temp: 98.9 F (37.2 C) (04/27 0628) BP: 139/47 mmHg (04/27 0635) Pulse Rate: 93  (04/27 0628)  Labs:  Basename 12/03/11 0710 12/02/11 0458 12/01/11 1342 12/01/11 0640  HGB 9.5* 8.8* -- --  HCT 28.4* 26.8* 26.9* --  PLT 182 198 -- 196  APTT -- -- -- --  LABPROT 18.9* 19.4* -- 15.5*  INR 1.55* 1.61* -- 1.20  HEPARINUNFRC -- -- -- --  CREATININE 0.76 0.72 -- 0.74  CKTOTAL -- -- -- --  CKMB -- -- -- --  TROPONINI -- -- -- --   Estimated Creatinine Clearance: 61.8 ml/min (by C-G formula based on Cr of 0.76).   Assessment:  POD # 3 s/p L-TKA for patient receiving Coumadin with Lovenox bridging for VTE prophylaxis.  INR  1.2  >>  1.55.   WBC/Hgb/Hct/Plts:  9.7/9.5/28.4/182 (04/27 0710)  No significant bleeding noted. Goal of Therapy:   INR 2-3   Plan:   Coumadin 7.5 mg po today. Then rec 5 mg daily as discharge dose w/ f/u INR Monday 12/05/11 Daily INR's, CBC.  Herby Abraham, Pharm.D. [phone] 12/03/2011 1:41 PM

## 2011-12-03 NOTE — Progress Notes (Signed)
PT Progress Note:     12/03/11 1400  PT Visit Information  Last PT Received On 12/03/11  Assistance Needed +1  PT Time Calculation  PT Start Time 1115  PT Stop Time 1147  PT Time Calculation (min) 32 min  Precautions  Precautions Knee  Required Braces or Orthoses Knee Immobilizer - Left  Knee Immobilizer - Left On when out of bed or walking  Restrictions  LLE Weight Bearing WBAT  Cognition  Overall Cognitive Status Appears within functional limits for tasks assessed/performed  Arousal/Alertness Awake/alert  Orientation Level Appears intact for tasks assessed  Behavior During Session [facility] for tasks performed  Bed Mobility  Bed Mobility Supine to Sit  Supine to Sit [facility] flat;5: Supervision  Details for Bed Mobility Assistance Supervision for safety. No physical (A) needed.   Transfers  Transfers Sit to Stand;Stand to Sit  Sit to Stand 6: Modified independent (Device/Increase time);With upper extremity assist;With armrests;From bed;From chair/3-in-1  Stand to Sit 6: Modified independent (Device/Increase time);With upper extremity assist;With armrests;To chair/3-in-1  Ambulation/Gait  Ambulation/Gait Assistance 5: Supervision  Ambulation Distance (Feet) 100 Feet  Assistive device Rolling walker  Ambulation/Gait Assistance Details Supervision for safety due to pain (6/10).  No physical (A) needed.  Cues for encouragement to increase step length RLE to facilitate increased fluidity of gait  Gait Pattern Step-to pattern;Decreased stance time - left;Decreased step length - right  Stairs Yes  Stairs Assistance 5: Supervision  Stairs Assistance Details (indicate cue type and reason) Pt able to perform stairs without any cues for technique.    Stair Management Technique Two rails;Step to pattern;Forwards  Number of Stairs 3   Wheelchair Mobility  Wheelchair Mobility No  Exercises  Exercises Total Joint  Total Joint Exercises  Ankle Circles/Pumps AROM;Both;10 reps;Supine  Quad Sets  AROM;Strengthening;Both;10 reps;Supine  Heel Slides AAROM;Left;10 reps;Supine  Straight Leg Raises AAROM;Strengthening;Left;10 reps;Supine  PT - End of Session  Equipment Utilized During Treatment Gait belt;Left knee immobilizer  Activity Tolerance Patient tolerated treatment well;Patient limited by pain  Patient left in chair;with call bell/phone within reach;with family/visitor present  PT - Assessment/Plan  Comments on Treatment Session Pt appears to have more strength & energy today than yesterday.  Mobilizing well enough to d/c home with family when MD feels medically ready.   PT Plan Discharge plan remains appropriate;Frequency remains appropriate  Follow Up Recommendations Home health PT  Equipment Recommended None recommended by PT;None recommended by OT  Acute Rehab PT Goals  PT Goal: Supine/Side to Sit - Progress Met  PT Goal: Sit to Stand - Progress Met  PT Goal: Ambulate - Progress Progressing toward goal  PT Goal: Up/Down Stairs - Progress Met  PT Goal: Perform Home Exercise Program - Progress Progressing toward goal       Verdell Face, [place] [phone] 12/03/2011

## 2011-12-03 NOTE — Progress Notes (Signed)
   CARE MANAGEMENT NOTE 12/03/2011  Patient:  Kirsten Horton, Kirsten Horton   Account Number:  [id number]  Date Initiated:  12/03/2011  Documentation initiated by:  [facility]  Subjective/Objective Assessment:   TOTAL LEFT KNEE REPLACEMENT     Action/Plan:   has DME at home   Anticipated DC Date:  12/03/2011   Anticipated DC Plan:  HOME W HOME HEALTH SERVICES      DC Planning Services  CM consult      [facility] Choice  HOME HEALTH   Choice offered to / List presented to:  C-1 Patient        HH arranged  HH-2 PT      [facility] agency  [facility]   Status of service:  Completed, signed off Medicare Important Message given?   (If response is "NO", the following Medicare IM given date fields will be blank) Date Medicare IM given:   Date Additional Medicare IM given:    Discharge Disposition:  HOME W HOME HEALTH SERVICES  Per UR Regulation:    If discussed at Long Length of Stay Meetings, dates discussed:    Comments:  12/03/2011 1500 Contacted [facility] with referral for [facility] PT for scheduled d/c today. Faxed orders, H&P and facesheet to [place]. Isidoro Donning RN CCM Case Mgmt phone [phone]

## 2011-12-09 LAB — CULTURE, BLOOD (ROUTINE X 2)
Culture  Setup Time: 201304270156
Culture: NO GROWTH

## 2011-12-28 NOTE — Discharge Summary (Signed)
Kirsten Horton, Kirsten Horton            ACCOUNT NO.:  [id number]  MEDICAL RECORD NO.:  [id number]  LOCATION:  5041                         FACILITY:  [facility]  PHYSICIAN:  Genene Churn. Barry Dienes, P.A.   DATE OF BIRTH:  06-20-1941  DATE OF ADMISSION:  11/30/2011 DATE OF DISCHARGE:  12/03/2011                              DISCHARGE SUMMARY   FINAL DIAGNOSES: 1. 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 2. Hypertension. 3. Hyperlipidemia. 4. Asthma.  HISTORY OF PRESENT ILLNESS:  A 71 year old black female with a history of end-stage DJD of left knee and chronic pain, presented to our office for preop evaluation for total knee replacement.  She had progressively worsening pain with failed response with conservative treatment. Significant decrease in her daily activities due to the ongoing complaint.  HOSPITAL COURSE:  On November 30, 2011, the patient was taken to the [facility] OR and a left total knee replacement procedure was performed. Surgeon, Loreta Ave, M.D. Assistant, Zonia Kief, PA-C. Anesthesia was general with femoral nerve block. Estimated blood loss was minimal. One Hemovac drain placed.  No specimens or cultures.  All counts were correct. Tourniquet time was 84 minutes.  There were no surgical or anesthesia complications, and the patient was transferred to recovery in stable.  After arriving to the Orthopedic Unit, pharmacy protocol of Coumadin and Lovenox started for DVT prophylaxis.  December 01, 2011, the patient was doing well without complaints.  Hemoglobin 8.9, hematocrit 27.0.  INR 1.20.  Slight bleeding through dressing.  Calf nontender, neurovascularly intact.  Discontinued Dilaudid.  PT/OT consults.  December 02, 2011, the patient was doing well with good pain control.  Complained of feeling fatigued and weak when doing therapy.  Denies chest pain or shortness of breath.  No bowel movement yet.  Hemoglobin 8.8, hematocrit 26.8.  Sodium 141,  potassium 3.7, chloride 104, CO2 of 27, BUN 7, creatinine 0.72, glucose 120, INR 1.61.  Left knee wound looked good and staples intact.  No drainage or signs of infection.  Calf nontender. Hemovac drain removed.  For symptomatic acute blood loss anemia, ordered transfusion of 1 unit packed red blood cells.  Dulcolax suppository ordered for constipation.  December 03, 2011, the patient was doing well and without complaints.  Stable and ready for discharge home.  CONDITION:  Good and stable.  DISPOSITION:  Discharged home.  DISCHARGE MEDICATIONS:  See after visit summary.  INSTRUCTIONS:  While at home, the patient will continue work with PT and OT to improve ambulation and knee range of motion and strengthening. Weight bear as tolerated.  Remain on Coumadin until 4 weeks postop for DVT prophylaxis.  Discontinue Lovenox when Coumadin is therapeutic with INR 2-3.  Okay to shower, but no tub soaking.  Daily dressing changes with 4x4 gauze and apply TED hose over this period.  Follow up visit with Dr. Eulah Pont 2 weeks postop for recheck.  Return sooner.     Genene Churn. Denton Meek.     JMO/MEDQ  D:  12/28/2011  T:  12/28/2011  Job:  [id number]

## 2012-08-08 DIAGNOSIS — H4010X0 Unspecified open-angle glaucoma, stage unspecified: Secondary | ICD-10-CM

## 2012-08-08 DIAGNOSIS — K5730 Diverticulosis of large intestine without perforation or abscess without bleeding: Secondary | ICD-10-CM

## 2012-08-08 HISTORY — DX: Unspecified open-angle glaucoma, stage unspecified: H40.10X0

## 2012-08-08 HISTORY — DX: Diverticulosis of large intestine without perforation or abscess without bleeding: K57.30

## 2013-05-13 LAB — HM COLONOSCOPY

## 2013-06-28 ENCOUNTER — Other Ambulatory Visit: Payer: Self-pay | Admitting: Internal Medicine

## 2013-06-28 DIAGNOSIS — Z1231 Encounter for screening mammogram for malignant neoplasm of breast: Secondary | ICD-10-CM

## 2013-07-23 DIAGNOSIS — H35049 Retinal micro-aneurysms, unspecified, unspecified eye: Secondary | ICD-10-CM | POA: Insufficient documentation

## 2013-07-23 DIAGNOSIS — H4010X0 Unspecified open-angle glaucoma, stage unspecified: Secondary | ICD-10-CM | POA: Insufficient documentation

## 2013-07-23 DIAGNOSIS — K5730 Diverticulosis of large intestine without perforation or abscess without bleeding: Secondary | ICD-10-CM | POA: Insufficient documentation

## 2013-07-23 HISTORY — DX: Retinal micro-aneurysms, unspecified, unspecified eye: H35.049

## 2013-08-05 ENCOUNTER — Ambulatory Visit
Admission: RE | Admit: 2013-08-05 | Discharge: 2013-08-05 | Disposition: A | Discharge status: Home / Self Care | Payer: Medicare Other | Source: Ambulatory Visit | Attending: Internal Medicine | Admitting: Internal Medicine

## 2013-08-05 DIAGNOSIS — Z1231 Encounter for screening mammogram for malignant neoplasm of breast: Secondary | ICD-10-CM

## 2014-08-08 DIAGNOSIS — D692 Other nonthrombocytopenic purpura: Secondary | ICD-10-CM

## 2014-08-08 HISTORY — DX: Other nonthrombocytopenic purpura: D69.2

## 2014-09-16 ENCOUNTER — Other Ambulatory Visit: Payer: Self-pay | Admitting: Dermatology

## 2015-02-23 DIAGNOSIS — D692 Other nonthrombocytopenic purpura: Secondary | ICD-10-CM | POA: Insufficient documentation

## 2015-04-23 ENCOUNTER — Emergency Department (INDEPENDENT_AMBULATORY_CARE_PROVIDER_SITE_OTHER)
Admission: EM | Admit: 2015-04-23 | Discharge: 2015-04-23 | Disposition: A | Discharge status: Other Acute Inpatient Hospital | Payer: Medicare Other | Source: Home / Self Care | Attending: Family Medicine | Admitting: Family Medicine

## 2015-04-23 ENCOUNTER — Encounter (HOSPITAL_COMMUNITY): Payer: Self-pay | Admitting: *Deleted

## 2015-04-23 ENCOUNTER — Emergency Department (HOSPITAL_COMMUNITY)
Admission: EM | Admit: 2015-04-23 | Discharge: 2015-04-24 | Disposition: A | Discharge status: Home / Self Care | Payer: Medicare Other | Attending: Emergency Medicine | Admitting: Emergency Medicine

## 2015-04-23 ENCOUNTER — Encounter (HOSPITAL_COMMUNITY): Payer: Self-pay | Admitting: Emergency Medicine

## 2015-04-23 DIAGNOSIS — K2970 Gastritis, unspecified, without bleeding: Secondary | ICD-10-CM | POA: Insufficient documentation

## 2015-04-23 DIAGNOSIS — R2243 Localized swelling, mass and lump, lower limb, bilateral: Secondary | ICD-10-CM | POA: Diagnosis not present

## 2015-04-23 DIAGNOSIS — Z880 Allergy status to penicillin: Secondary | ICD-10-CM | POA: Diagnosis not present

## 2015-04-23 DIAGNOSIS — J45909 Unspecified asthma, uncomplicated: Secondary | ICD-10-CM | POA: Diagnosis not present

## 2015-04-23 DIAGNOSIS — Z79899 Other long term (current) drug therapy: Secondary | ICD-10-CM | POA: Diagnosis not present

## 2015-04-23 DIAGNOSIS — R1012 Left upper quadrant pain: Secondary | ICD-10-CM

## 2015-04-23 DIAGNOSIS — Z791 Long term (current) use of non-steroidal anti-inflammatories (NSAID): Secondary | ICD-10-CM | POA: Insufficient documentation

## 2015-04-23 DIAGNOSIS — M1990 Unspecified osteoarthritis, unspecified site: Secondary | ICD-10-CM | POA: Diagnosis not present

## 2015-04-23 LAB — CBC
HCT: 38 % (ref 36.0–46.0)
HEMOGLOBIN: 12.7 g/dL (ref 12.0–15.0)
MCH: 32.2 pg (ref 26.0–34.0)
MCHC: 33.4 g/dL (ref 30.0–36.0)
MCV: 96.2 fL (ref 78.0–100.0)
PLATELETS: 241 10*3/uL (ref 150–400)
RBC: 3.95 MIL/uL (ref 3.87–5.11)
RDW: 12.5 % (ref 11.5–15.5)
WBC: 8.9 10*3/uL (ref 4.0–10.5)

## 2015-04-23 LAB — COMPREHENSIVE METABOLIC PANEL
ALK PHOS: 127 U/L — AB (ref 38–126)
ALT: 14 U/L (ref 14–54)
ANION GAP: 6 (ref 5–15)
AST: 23 U/L (ref 15–41)
Albumin: 3.7 g/dL (ref 3.5–5.0)
BILIRUBIN TOTAL: 0.8 mg/dL (ref 0.3–1.2)
BUN: 9 mg/dL (ref 6–20)
CALCIUM: 8.8 mg/dL — AB (ref 8.9–10.3)
CO2: 28 mmol/L (ref 22–32)
CREATININE: 0.65 mg/dL (ref 0.44–1.00)
Chloride: 104 mmol/L (ref 101–111)
GFR calc non Af Amer: >60 mL/min (ref >60)
GLUCOSE: 121 mg/dL — AB (ref 65–99)
Potassium: 3.5 mmol/L (ref 3.5–5.1)
Sodium: 138 mmol/L (ref 135–145)
TOTAL PROTEIN: 7 g/dL (ref 6.5–8.1)

## 2015-04-23 LAB — LIPASE, BLOOD: Lipase: 29 U/L (ref 22–51)

## 2015-04-23 MED ORDER — ONDANSETRON 4 MG PO TBDP
4.0000 mg | ORAL_TABLET | Freq: Once | ORAL | Status: AC
  Administered 2015-04-23: 4 mg via ORAL

## 2015-04-23 MED ORDER — ONDANSETRON 4 MG PO TBDP
ORAL_TABLET | ORAL | Status: AC
  Filled 2015-04-23: qty 1

## 2015-04-23 MED ORDER — HYDROMORPHONE HCL 1 MG/ML IJ SOLN
INTRAMUSCULAR | Status: AC
  Filled 2015-04-23: qty 1

## 2015-04-23 MED ORDER — HYDROMORPHONE HCL 1 MG/ML IJ SOLN
1.0000 mg | Freq: Once | INTRAMUSCULAR | Status: AC
  Administered 2015-04-23: 1 mg via INTRAMUSCULAR

## 2015-04-23 MED ORDER — SODIUM CHLORIDE 0.9 % IV BOLUS (SEPSIS)
500.0000 mL | Freq: Once | INTRAVENOUS | Status: AC
  Administered 2015-04-24: 500 mL via INTRAVENOUS

## 2015-04-23 NOTE — ED Notes (Signed)
Pt  Reports   severe  Upper  Quadrant  Pain      X  2  Days     Nausea   No  Vomiting               Seen  By  PCP     YESTERDAY  WAS  RX   SOME  STOMACH  MEDS         Pt   Reports      Was  Given       meds  For  Her  Stomach          Yesterday  Not  Helping

## 2015-04-23 NOTE — ED Notes (Addendum)
Pt c/o LUQ pain onset 3-4 days ago.  Nausea without vomiting.  Pt received Dilaudid IM and Zofran at Urgent Care before coming to ED

## 2015-04-23 NOTE — ED Provider Notes (Signed)
CSN: [id number]     Arrival date & time 04/23/15  1654 History   First MD Initiated Contact with Patient 04/23/15 1848     Chief Complaint  Patient presents with  . Abdominal Pain   (Consider location/radiation/quality/duration/timing/severity/associated sxs/prior Treatment) Patient is a 74 y.o. female presenting with abdominal pain. The history is provided by the patient.  Abdominal Pain Pain location:  LUQ Pain quality: burning and sharp   Pain radiates to:  Does not radiate Pain severity:  Moderate Onset quality:  Sudden Duration:  2 days Progression:  Worsening Chronicity:  New (seen by lmd, given gerd med but sx worse.) Ineffective treatments:  Antacids Associated symptoms: anorexia and nausea   Associated symptoms: no chest pain, no constipation, no diarrhea, no dysuria, no fever and no vomiting     Past Medical History  Diagnosis Date  . Asthma     seasonal  . Arthritis    Past Surgical History  Procedure Laterality Date  . Abdominal hysterectomy    . Total knee arthroplasty  11/30/2011    Procedure: TOTAL KNEE ARTHROPLASTY;  Surgeon: Loreta Ave, MD;  Location: [facility] OR;  Service: Orthopedics;  Laterality: Left;  left total knee arthroplasty   History reviewed. No pertinent family history. Social History  Substance Use Topics  . Smoking status: Never Smoker   . Smokeless tobacco: Never Used  . Alcohol Use: 0.6 oz/week    1 Glasses of wine per week   OB History    No data available     Review of Systems  Constitutional: Positive for appetite change. Negative for fever and activity change.  Cardiovascular: Negative for chest pain.  Gastrointestinal: Positive for nausea, abdominal pain and anorexia. Negative for vomiting, diarrhea and constipation.  Genitourinary: Negative.  Negative for dysuria.  All other systems reviewed and are negative.   Allergies  Penicillins  Home Medications   Prior to Admission medications   Medication Sig Start Date End  Date Taking? Authorizing Provider  albuterol (PROVENTIL HFA;VENTOLIN HFA) 108 (90 BASE) MCG/ACT inhaler Inhale 2 puffs into the lungs every 6 (six) hours as needed. For shortness of breath    Historical Provider, MD  Fluticasone-Salmeterol (ADVAIR) 250-50 MCG/DOSE AEPB Inhale 1 puff into the lungs 2 (two) times daily.    Historical Provider, MD  pregabalin (LYRICA) 75 MG capsule Take 75 mg by mouth 2 (two) times daily.    Historical Provider, MD  raloxifene (EVISTA) 60 MG tablet Take 60 mg by mouth daily.    Historical Provider, MD   Meds Ordered and Administered this Visit  Medications - No data to display  BP 159/75 mmHg  Pulse 70  Temp(Src) 97.9 F (36.6 C) (Oral)  Resp 16  SpO2 98% No data found.   Physical Exam  Constitutional: She is oriented to person, place, and time. She appears well-developed and well-nourished.  Abdominal: Soft. Normal appearance. She exhibits no distension and no mass. Bowel sounds are decreased. There is no hepatosplenomegaly. There is tenderness in the left upper quadrant. There is no rigidity, no rebound, no guarding and no CVA tenderness.    Neurological: She is alert and oriented to person, place, and time.  Skin: Skin is warm and dry.  Nursing note and vitals reviewed.   ED Course  Procedures (including critical care time)  Labs Review Labs Reviewed - No data to display  Imaging Review No results found.   Visual Acuity Review  Right Eye Distance:   Left Eye Distance:  Bilateral Distance:    Right Eye Near:   Left Eye Near:    Bilateral Near:         MDM   1. Left upper quadrant pain    Sent for eval of luq pain 10/10, anorexia, sl nausea.    Linna Hoff, MD 04/23/15 [phone]

## 2015-04-23 NOTE — ED Notes (Signed)
Advised   npo 

## 2015-04-23 NOTE — ED Provider Notes (Signed)
CSN: [id number]     Arrival date & time 04/23/15  1943 History   This chart was scribed for Loren Racer, MD by Arlan Organ, ED Scribe. This patient was seen in room A03C/A03C and the patient's care was started 11:47 PM.   Chief Complaint  Patient presents with  . Abdominal Pain   The history is provided by the patient. No language interpreter was used.    HPI Comments: Kirsten Horton is a 74 y.o. female without any pertinent past medical history who presents to the Emergency Department complaining of constant, ongoing LUQ abdominal pain x 3-4 days. No aggravating or alleviating factors a this time. Associated nausea and diarrhea also reports. No noted blood in stools. Pt mentions a "sensation" in her chest earlier this afternoon. states episode only lasted a few minutes and she denies any other episodes today. No OTC medications or home remedies attempted prior to arrival. However, pt was seen at Urgent Care earlier today and was treated with Zofran and Dilaudid IM. No recent fever, chills, vomiting, or shortness of breath. No dysuria, frequency, or urinary urgency. Pt with known allergy to Penicillins.  Past Medical History  Diagnosis Date  . Asthma     seasonal  . Arthritis    Past Surgical History  Procedure Laterality Date  . Abdominal hysterectomy    . Total knee arthroplasty  11/30/2011    Procedure: TOTAL KNEE ARTHROPLASTY;  Surgeon: Loreta Ave, MD;  Location: [facility] OR;  Service: Orthopedics;  Laterality: Left;  left total knee arthroplasty   No family history on file. Social History  Substance Use Topics  . Smoking status: Never Smoker   . Smokeless tobacco: Never Used  . Alcohol Use: 0.6 oz/week    1 Glasses of wine per week   OB History    No data available     Review of Systems  Constitutional: Negative for fever and chills.  Respiratory: Negative for cough and shortness of breath.   Cardiovascular: Positive for chest pain and leg swelling. Negative for  palpitations.  Gastrointestinal: Positive for nausea, abdominal pain and diarrhea. Negative for vomiting and blood in stool.  Genitourinary: Negative for dysuria, frequency, flank pain and difficulty urinating.  Musculoskeletal: Negative for back pain, neck pain and neck stiffness.  Skin: Negative for rash.  Neurological: Negative for dizziness, weakness, numbness and headaches.  Psychiatric/Behavioral: Negative for confusion.  All other systems reviewed and are negative.     Allergies  Penicillins  Home Medications   Prior to Admission medications   Medication Sig Start Date End Date Taking? Authorizing Provider  albuterol (PROVENTIL HFA;VENTOLIN HFA) 108 (90 BASE) MCG/ACT inhaler Inhale 2 puffs into the lungs every 6 (six) hours as needed. For shortness of breath   Yes Historical Provider, MD  Fluticasone-Salmeterol (ADVAIR) 250-50 MCG/DOSE AEPB Inhale 1 puff into the lungs 2 (two) times daily.   Yes Historical Provider, MD  latanoprost (XALATAN) 0.005 % ophthalmic solution Place 1 drop into both eyes at bedtime. 02/26/15  Yes Historical Provider, MD  LIPITOR 80 MG tablet Take 80 mg by mouth daily. 03/02/15  Yes Historical Provider, MD  pregabalin (LYRICA) 75 MG capsule Take 75 mg by mouth 2 (two) times daily.   Yes Historical Provider, MD  raloxifene (EVISTA) 60 MG tablet Take 60 mg by mouth daily.   Yes Historical Provider, MD  ondansetron (ZOFRAN ODT) 4 MG disintegrating tablet 4mg  ODT q4 hours prn nausea/vomit 04/24/15   Loren Racer, MD  pantoprazole (PROTONIX) 20  MG tablet Take 1 tablet (20 mg total) by mouth daily. 04/24/15   Loren Racer, MD  traMADol (ULTRAM) 50 MG tablet Take 1 tablet (50 mg total) by mouth every 6 (six) hours as needed. 04/24/15   Loren Racer, MD   Triage Vitals: BP 125/50 mmHg  Pulse 55  Temp(Src) 98.5 F (36.9 C) (Oral)  Resp 14  Ht 5' (1.524 m)  Wt 184 lb (83.462 kg)  BMI 35.94 kg/m2  SpO2 96%   Physical Exam  Constitutional: She is  oriented to person, place, and time. She appears well-developed and well-nourished. No distress.  HENT:  Head: Normocephalic and atraumatic.  Mouth/Throat: Oropharynx is clear and moist.  Eyes: EOM are normal. Pupils are equal, round, and reactive to light.  Neck: Normal range of motion. Neck supple.  Cardiovascular: Normal rate and regular rhythm.   Pulmonary/Chest: Effort normal and breath sounds normal. No respiratory distress. She has no wheezes. She has no rales. She exhibits no tenderness.  Abdominal: Soft. Bowel sounds are normal. She exhibits no distension and no mass. There is tenderness (patient with left upper quadrant tenderness with palpation. No rebound or guarding.). There is no rebound and no guarding.  Musculoskeletal: Normal range of motion. She exhibits no edema or tenderness.  No CVA tenderness bilaterally. 1+ bilateral lower extremity pitting edema.  Neurological: She is alert and oriented to person, place, and time.  Moves all extremities without deficit. Sensation is grossly intact.  Skin: Skin is warm and dry. No rash noted. No erythema.  Psychiatric: She has a normal mood and affect. Her behavior is normal.  Nursing note and vitals reviewed.   ED Course  Procedures (including critical care time)  DIAGNOSTIC STUDIES: Oxygen Saturation is 100% on RA, Normal by my interpretation.    COORDINATION OF CARE: 11:51 PM- Will give fluids. Will order CT abdomen pelvis with contrast, i-stat troponin, EKG, Lipase, CMP, CBC, and urinalysis. Discussed treatment plan with pt at bedside and pt agreed to plan.     Labs Review Labs Reviewed  COMPREHENSIVE METABOLIC PANEL - Abnormal; Notable for the following:    Glucose, Bld 121 (*)    Calcium 8.8 (*)    Alkaline Phosphatase 127 (*)    All other components within normal limits  URINALYSIS, ROUTINE W REFLEX MICROSCOPIC (NOT AT [facility]) - Abnormal; Notable for the following:    Hgb urine dipstick TRACE (*)    Ketones, ur 15 (*)     All other components within normal limits  URINE MICROSCOPIC-ADD ON - Abnormal; Notable for the following:    Squamous Epithelial / LPF FEW (*)    Casts HYALINE CASTS (*)    All other components within normal limits  LIPASE, BLOOD  CBC  I-STAT TROPOININ, ED    Imaging Review Ct Abdomen Pelvis W Contrast  04/24/2015   CLINICAL DATA:  Left upper quadrant pain for 4 days, diarrhea. Progressive pain tonight.  EXAM: CT ABDOMEN AND PELVIS WITH CONTRAST  TECHNIQUE: Multidetector CT imaging of the abdomen and pelvis was performed using the standard protocol following bolus administration of intravenous contrast.  CONTRAST:  OMNIPAQUE IOHEXOL 300 MG/ML  SOLN  COMPARISON:  None.  FINDINGS: There is 7 mm nodule in the left lower lobe.  No consolidation.  The liver, gallbladder, spleen, pancreas, and adrenal glands are normal. The kidneys demonstrate symmetric enhancement without hydronephrosis there are right renal cysts including a parapelvic cyst in the upper pole. No suspicious renal lesion.  Small hiatal hernia. Stomach  is decompressed. There are no dilated or thickened small bowel loops. There is submucosal fatty infiltration throughout the colon. No colonic wall thickening. No pericolonic inflammatory change. No significant diverticular disease. The appendix is normal. No free air, free fluid, or intra-abdominal fluid collection.  No retroperitoneal adenopathy. Abdominal aorta is normal in caliber. Mild atherosclerosis of the abdominal aorta and its branches. Small fat containing umbilical hernia.  Within the pelvis the urinary bladder is minimally distended. The uterus is surgically absent. There is no adnexal mass. No pelvic free fluid or adenopathy.  There are no acute or suspicious osseous abnormalities. Degenerative change throughout the spine and involving both hips. There is grade 1 anterolisthesis of L4 on L5 that appears degenerative.  IMPRESSION: 1. No acute abnormality in the  abdomen/pelvis. 2. Diffuse submucosal fatty infiltration throughout the colon, suggesting chronic inflammation. No acute inflammatory change at this time. 3. Left lower lobe pulmonary nodule measures 7 mm. If the patient is at high risk for bronchogenic carcinoma, follow-up chest CT at 3-41months is recommended. If the patient is at low risk for bronchogenic carcinoma, follow-up chest CT at 6-12 months is recommended. This recommendation follows the consensus statement: Guidelines for Management of Small Pulmonary Nodules Detected on CT Scans: A Statement from the Fleischner Society as published in Radiology 2005; 237:395-400. 4. Incidental findings of small hiatal hernia, small fat containing umbilical hernia, and right renal cysts.   Electronically Signed   By: Rubye Oaks M.D.   On: 04/24/2015 01:17   I have personally reviewed and evaluated these images and lab results as part of my medical decision-making.   EKG Interpretation   Date/Time:  Thursday April 23 2015 23:59:56 EDT Ventricular Rate:  51 PR Interval:  154 QRS Duration: 86 QT Interval:  518 QTC Calculation: 477 R Axis:   48 Text Interpretation:  Sinus rhythm Confirmed by Ranae Palms  MD, Damiean Lukes  ([id number]) on 04/24/2015 12:22:43 AM      MDM   Final diagnoses:  Gastritis    I personally performed the services described in this documentation, which was scribed in my presence. The recorded information has been reviewed and is accurate.  CT without any acute findings for the patient's symptoms. Patient is feeling much better after GI cocktail. Suspect gastritis. Will start on PPI and have follow-up with gastroenerology. Return precautions given  Loren Racer, MD 04/24/15 0700

## 2015-04-24 ENCOUNTER — Emergency Department (HOSPITAL_COMMUNITY): Payer: Medicare Other

## 2015-04-24 ENCOUNTER — Encounter (HOSPITAL_COMMUNITY): Payer: Self-pay | Admitting: Radiology

## 2015-04-24 LAB — URINALYSIS, ROUTINE W REFLEX MICROSCOPIC
Bilirubin Urine: NEGATIVE
GLUCOSE, UA: NEGATIVE mg/dL
KETONES UR: 15 mg/dL — AB
Leukocytes, UA: NEGATIVE
Nitrite: NEGATIVE
PROTEIN: NEGATIVE mg/dL
Specific Gravity, Urine: 1.018 (ref 1.005–1.030)
Urobilinogen, UA: 1 mg/dL (ref 0.0–1.0)
pH: 6 (ref 5.0–8.0)

## 2015-04-24 LAB — URINE MICROSCOPIC-ADD ON

## 2015-04-24 LAB — I-STAT TROPONIN, ED: Troponin i, poc: 0 ng/mL (ref 0.00–0.08)

## 2015-04-24 MED ORDER — GI COCKTAIL ~~LOC~~
30.0000 mL | Freq: Once | ORAL | Status: AC
  Administered 2015-04-24: 30 mL via ORAL
  Filled 2015-04-24: qty 30

## 2015-04-24 MED ORDER — IOHEXOL 300 MG/ML  SOLN
100.0000 mL | Freq: Once | INTRAMUSCULAR | Status: AC | PRN
  Administered 2015-04-24: 100 mL via INTRAVENOUS

## 2015-04-24 MED ORDER — PANTOPRAZOLE SODIUM 20 MG PO TBEC: 20.0000 mg | DELAYED_RELEASE_TABLET | Freq: Every day | ORAL | Status: DC

## 2015-04-24 MED ORDER — ONDANSETRON 4 MG PO TBDP: ORAL_TABLET | ORAL | Status: DC

## 2015-04-24 MED ORDER — TRAMADOL HCL 50 MG PO TABS: 50.0000 mg | ORAL_TABLET | Freq: Four times a day (QID) | ORAL | Status: DC | PRN

## 2015-04-24 NOTE — Discharge Instructions (Signed)

## 2016-05-18 ENCOUNTER — Other Ambulatory Visit: Payer: Self-pay | Admitting: Internal Medicine

## 2016-05-18 DIAGNOSIS — Z1231 Encounter for screening mammogram for malignant neoplasm of breast: Secondary | ICD-10-CM

## 2016-05-24 ENCOUNTER — Ambulatory Visit
Admission: RE | Admit: 2016-05-24 | Discharge: 2016-05-24 | Disposition: A | Discharge status: Home / Self Care | Payer: Medicare Other | Source: Ambulatory Visit | Attending: Internal Medicine | Admitting: Internal Medicine

## 2016-05-24 DIAGNOSIS — Z1231 Encounter for screening mammogram for malignant neoplasm of breast: Secondary | ICD-10-CM

## 2017-06-12 DIAGNOSIS — H401134 Primary open-angle glaucoma, bilateral, indeterminate stage: Secondary | ICD-10-CM | POA: Insufficient documentation

## 2017-06-12 DIAGNOSIS — H5203 Hypermetropia, bilateral: Secondary | ICD-10-CM

## 2017-06-12 DIAGNOSIS — H2513 Age-related nuclear cataract, bilateral: Secondary | ICD-10-CM

## 2017-06-12 HISTORY — DX: Age-related nuclear cataract, bilateral: H25.13

## 2017-06-12 HISTORY — DX: Hypermetropia, bilateral: H52.03

## 2017-06-18 DIAGNOSIS — H40033 Anatomical narrow angle, bilateral: Secondary | ICD-10-CM | POA: Insufficient documentation

## 2017-06-18 HISTORY — DX: Anatomical narrow angle, bilateral: H40.033

## 2018-06-12 ENCOUNTER — Other Ambulatory Visit: Payer: Self-pay | Admitting: Internal Medicine

## 2018-06-12 DIAGNOSIS — Z1231 Encounter for screening mammogram for malignant neoplasm of breast: Secondary | ICD-10-CM

## 2018-07-25 ENCOUNTER — Ambulatory Visit
Admission: RE | Admit: 2018-07-25 | Discharge: 2018-07-25 | Disposition: A | Discharge status: Home / Self Care | Payer: Medicare Other | Source: Ambulatory Visit | Attending: Internal Medicine | Admitting: Internal Medicine

## 2018-07-25 DIAGNOSIS — Z1231 Encounter for screening mammogram for malignant neoplasm of breast: Secondary | ICD-10-CM

## 2018-10-16 ENCOUNTER — Inpatient Hospital Stay (HOSPITAL_COMMUNITY)
Admission: EM | Admit: 2018-10-16 | Discharge: 2018-10-20 | DRG: 554 | Disposition: A | Discharge status: Home / Self Care | Payer: Medicare Other | Attending: Internal Medicine | Admitting: Internal Medicine

## 2018-10-16 ENCOUNTER — Emergency Department (HOSPITAL_COMMUNITY): Payer: Medicare Other

## 2018-10-16 DIAGNOSIS — I10 Essential (primary) hypertension: Secondary | ICD-10-CM | POA: Diagnosis present

## 2018-10-16 DIAGNOSIS — Z79899 Other long term (current) drug therapy: Secondary | ICD-10-CM

## 2018-10-16 DIAGNOSIS — Z90710 Acquired absence of both cervix and uterus: Secondary | ICD-10-CM

## 2018-10-16 DIAGNOSIS — Z6837 Body mass index (BMI) 37.0-37.9, adult: Secondary | ICD-10-CM

## 2018-10-16 DIAGNOSIS — Z880 Allergy status to penicillin: Secondary | ICD-10-CM

## 2018-10-16 DIAGNOSIS — W19XXXA Unspecified fall, initial encounter: Secondary | ICD-10-CM | POA: Diagnosis present

## 2018-10-16 DIAGNOSIS — M25561 Pain in right knee: Secondary | ICD-10-CM | POA: Diagnosis not present

## 2018-10-16 DIAGNOSIS — D72829 Elevated white blood cell count, unspecified: Secondary | ICD-10-CM | POA: Diagnosis present

## 2018-10-16 DIAGNOSIS — M1711 Unilateral primary osteoarthritis, right knee: Principal | ICD-10-CM | POA: Diagnosis present

## 2018-10-16 DIAGNOSIS — R7989 Other specified abnormal findings of blood chemistry: Secondary | ICD-10-CM | POA: Diagnosis present

## 2018-10-16 DIAGNOSIS — Z66 Do not resuscitate: Secondary | ICD-10-CM | POA: Diagnosis present

## 2018-10-16 DIAGNOSIS — E669 Obesity, unspecified: Secondary | ICD-10-CM | POA: Diagnosis present

## 2018-10-16 DIAGNOSIS — M25461 Effusion, right knee: Secondary | ICD-10-CM

## 2018-10-16 DIAGNOSIS — E785 Hyperlipidemia, unspecified: Secondary | ICD-10-CM | POA: Diagnosis present

## 2018-10-16 DIAGNOSIS — Z90722 Acquired absence of ovaries, bilateral: Secondary | ICD-10-CM

## 2018-10-16 DIAGNOSIS — R21 Rash and other nonspecific skin eruption: Secondary | ICD-10-CM | POA: Diagnosis present

## 2018-10-16 DIAGNOSIS — M25469 Effusion, unspecified knee: Secondary | ICD-10-CM | POA: Diagnosis present

## 2018-10-16 DIAGNOSIS — S8991XA Unspecified injury of right lower leg, initial encounter: Secondary | ICD-10-CM

## 2018-10-16 DIAGNOSIS — S8391XA Sprain of unspecified site of right knee, initial encounter: Secondary | ICD-10-CM | POA: Diagnosis present

## 2018-10-16 DIAGNOSIS — Z96652 Presence of left artificial knee joint: Secondary | ICD-10-CM | POA: Diagnosis present

## 2018-10-16 DIAGNOSIS — J45909 Unspecified asthma, uncomplicated: Secondary | ICD-10-CM | POA: Diagnosis present

## 2018-10-16 LAB — HEPATIC FUNCTION PANEL
ALBUMIN: 3.1 g/dL — AB (ref 3.5–5.0)
ALT: 113 U/L — AB (ref 0–44)
AST: 86 U/L — AB (ref 15–41)
Alkaline Phosphatase: 251 U/L — ABNORMAL HIGH (ref 38–126)
Bilirubin, Direct: 0.2 mg/dL (ref 0.0–0.2)
Indirect Bilirubin: 0.9 mg/dL (ref 0.3–0.9)
Total Bilirubin: 1.1 mg/dL (ref 0.3–1.2)
Total Protein: 7.1 g/dL (ref 6.5–8.1)

## 2018-10-16 LAB — CBC WITH DIFFERENTIAL/PLATELET
ABS IMMATURE GRANULOCYTES: 0.04 10*3/uL (ref 0.00–0.07)
BASOS PCT: 0 %
Basophils Absolute: 0 10*3/uL (ref 0.0–0.1)
Eosinophils Absolute: 0 10*3/uL (ref 0.0–0.5)
Eosinophils Relative: 0 %
HCT: 36.8 % (ref 36.0–46.0)
Hemoglobin: 11.6 g/dL — ABNORMAL LOW (ref 12.0–15.0)
IMMATURE GRANULOCYTES: 0 %
Lymphocytes Relative: 10 %
Lymphs Abs: 1.1 10*3/uL (ref 0.7–4.0)
MCH: 31.4 pg (ref 26.0–34.0)
MCHC: 31.5 g/dL (ref 30.0–36.0)
MCV: 99.5 fL (ref 80.0–100.0)
Monocytes Absolute: 1.1 10*3/uL — ABNORMAL HIGH (ref 0.1–1.0)
Monocytes Relative: 10 %
NEUTROS ABS: 8.2 10*3/uL — AB (ref 1.7–7.7)
NEUTROS PCT: 80 %
PLATELETS: 194 10*3/uL (ref 150–400)
RBC: 3.7 MIL/uL — ABNORMAL LOW (ref 3.87–5.11)
RDW: 12.6 % (ref 11.5–15.5)
WBC: 10.4 10*3/uL (ref 4.0–10.5)
nRBC: 0 % (ref 0.0–0.2)

## 2018-10-16 LAB — PROTIME-INR
INR: 1.2 (ref 0.8–1.2)
Prothrombin Time: 14.7 seconds (ref 11.4–15.2)

## 2018-10-16 LAB — BASIC METABOLIC PANEL
Anion gap: 9 (ref 5–15)
BUN: 10 mg/dL (ref 8–23)
CHLORIDE: 102 mmol/L (ref 98–111)
CO2: 26 mmol/L (ref 22–32)
Calcium: 8.4 mg/dL — ABNORMAL LOW (ref 8.9–10.3)
Creatinine, Ser: 0.73 mg/dL (ref 0.44–1.00)
GFR calc Af Amer: >60 mL/min (ref >60)
GFR calc non Af Amer: >60 mL/min (ref >60)
Glucose, Bld: 119 mg/dL — ABNORMAL HIGH (ref 70–99)
Potassium: 3.5 mmol/L (ref 3.5–5.1)
SODIUM: 137 mmol/L (ref 135–145)

## 2018-10-16 MED ORDER — HYDROCODONE-ACETAMINOPHEN 5-325 MG PO TABS
1.0000 | ORAL_TABLET | Freq: Once | ORAL | Status: AC
Start: 2018-10-16 — End: 2018-10-16
  Administered 2018-10-16: 1 via ORAL
  Filled 2018-10-16: qty 1

## 2018-10-16 MED ORDER — OXYCODONE HCL 5 MG PO TABS
5.0000 mg | ORAL_TABLET | Freq: Once | ORAL | Status: AC
  Administered 2018-10-16: 5 mg via ORAL
  Filled 2018-10-16: qty 1

## 2018-10-16 MED ORDER — LIDOCAINE HCL (PF) 1 % IJ SOLN
30.0000 mL | Freq: Once | INTRAMUSCULAR | Status: AC
  Administered 2018-10-16: 30 mL
  Filled 2018-10-16: qty 30

## 2018-10-16 MED ORDER — ONDANSETRON 4 MG PO TBDP
4.0000 mg | ORAL_TABLET | Freq: Once | ORAL | Status: AC
  Administered 2018-10-16: 4 mg via ORAL
  Filled 2018-10-16: qty 1

## 2018-10-16 NOTE — ED Notes (Signed)
Bed: [facility] Expected date:  Expected time:  Means of arrival:  Comments: EMS 78yo twisted knee

## 2018-10-16 NOTE — ED Notes (Signed)
Pt attempted to ambulate with walker, however was only able to stand up, felt "trembly" sts can't bear weight due to the pain, and had to sit back down due to the pain. PA Abby was notified.

## 2018-10-16 NOTE — ED Triage Notes (Signed)
Pt states she was walking and had a mechanical fall 2 days ago. States she landed on her R knee. Pt has taken tylenol and iced it at home w/o relief. Alert and oriented. No obvious deformity noted. Some swelling noted.

## 2018-10-16 NOTE — ED Notes (Signed)
Pt ambulated from bed.  Pt only able to take 3 to 4 steps before stating she was unable to go any further.  RN notified.

## 2018-10-16 NOTE — ED Provider Notes (Signed)
[facility]-EMERGENCY DEPT Provider Note   CSN: [id number] Arrival date & time: 10/16/18  1304    History   Chief Complaint Chief Complaint  Patient presents with  . Knee Injury    HPI Kirsten Horton is a 78 y.o. female.  Who presents the emergency department chief complaint of right knee injury.  Patient states that he days ago she was walking down her steps when she planted her right knee and twisted.  She had immediate severe pain and fell onto the right knee with her leg in flexion underneath her.  She states that she had some help getting up and has been able to very mildly toe-touch ambulate with a walker however as of today her pain was so severe she was unable to get out of bed at all today.  Patient also noticed an itchy rash over her legs.  She is unsure as to the cause of this rash.  Patient has been taking Tylenol has no previous history of allergy to such and only has a previous history of allergy to penicillin.  Rashes over her legs and some on her upper chest.  She denies any wheezing, oral or pharyngeal swelling.      HPI  Past Medical History:  Diagnosis Date  . Arthritis   . Asthma    seasonal    Patient Active Problem List   Diagnosis Date Noted  . HYPERLIPIDEMIA 10/05/2006  . RHINITIS, ALLERGIC 10/05/2006  . ASTHMA, UNSPECIFIED 10/05/2006  . OSTEOARTHRITIS, LOWER LEG 10/05/2006    Past Surgical History:  Procedure Laterality Date  . ABDOMINAL HYSTERECTOMY    . TOTAL KNEE ARTHROPLASTY  11/30/2011   Procedure: TOTAL KNEE ARTHROPLASTY;  Surgeon: Loreta Aveaniel F Murphy, MD;  [facility];  Service: Orthopedics;  Laterality: Left;  left total knee arthroplasty     OB History   No obstetric history on file.      Home Medications    Prior to Admission medications   Medication Sig Start Date End Date Taking? Authorizing Provider  albuterol (PROVENTIL HFA;VENTOLIN HFA) 108 (90 BASE) MCG/ACT inhaler Inhale 2 puffs into the  lungs every 6 (six) hours as needed. For shortness of breath    [provider]  Fluticasone-Salmeterol (ADVAIR) 250-50 MCG/DOSE AEPB Inhale 1 puff into the lungs 2 (two) times daily.    [provider]  latanoprost (XALATAN) 0.005 % ophthalmic solution Place 1 drop into both eyes at bedtime. 02/26/15   [provider]  LIPITOR 80 MG tablet Take 80 mg by mouth daily. 03/02/15   [provider]  ondansetron (ZOFRAN ODT) 4 MG disintegrating tablet 4mg  ODT q4 hours prn nausea/vomit 04/24/15   Loren RacerYelverton, David, MD  pantoprazole (PROTONIX) 20 MG tablet Take 1 tablet (20 mg total) by mouth daily. 04/24/15   Loren RacerYelverton, David, MD  pregabalin (LYRICA) 75 MG capsule Take 75 mg by mouth 2 (two) times daily.    [provider]  raloxifene (EVISTA) 60 MG tablet Take 60 mg by mouth daily.    [provider]  traMADol (ULTRAM) 50 MG tablet Take 1 tablet (50 mg total) by mouth every 6 (six) hours as needed. 04/24/15   Loren RacerYelverton, David, MD    Family History No family history on file.  Social History Social History   Tobacco Use  . Smoking status: Never Smoker  . Smokeless tobacco: Never Used  Substance Use Topics  . Alcohol use: Yes    Alcohol/week: 1.0 standard drinks  Types: 1 Glasses of wine per week  . Drug use: Not on file     Allergies   Penicillins   Review of Systems Review of Systems  Ten systems reviewed and are negative for acute change, except as noted in the HPI.   Physical Exam Updated Vital Signs BP (!) 138/100   Pulse 84   Temp 99.3 F (37.4 C) (Oral)   Resp 16   SpO2 98%   Physical Exam Physical Exam  Nursing note and vitals reviewed. Constitutional: She is oriented to person, place, and time. She appears well-developed and well-nourished. No distress.  HENT:  Head: Normocephalic and atraumatic.  Eyes: Conjunctivae normal and EOM are normal. Pupils are equal, round, and reactive to light. No scleral icterus.    Neck: Normal range of motion.  Cardiovascular: Normal rate, regular rhythm and normal heart sounds.  Exam reveals no gallop and no friction rub.   No murmur heard. Pulmonary/Chest: Effort normal and breath sounds normal. No respiratory distress.  Abdominal: Soft. Bowel sounds are normal. She exhibits no distension and no mass. There is no tenderness. There is no guarding.  Musculoskeletal: Left knee with well-healed midline surgical scar.  Right knee with tense effusion.  Warm to the touch.  Exquisitely tender to palpation. Neurological: She is alert and oriented to person, place, and time.  Skin: Skin is warm and dry. She is not diaphoretic. Non-blanchable erythematous and violaceous macular rash.  Some with central pustule.   ED Treatments / Results  Labs (all labs ordered are listed, but only abnormal results are displayed) Labs Reviewed  BASIC METABOLIC PANEL  CBC WITH DIFFERENTIAL/PLATELET    EKG None  Radiology No results found.  Procedures .Joint Aspiration/Arthrocentesis Date/Time: 10/16/2018 9:11 PM Performed by: Arthor Captain, PA-C Authorized by: Arthor Captain, PA-C   Consent:    Consent obtained:  Verbal   Consent given by:  Patient   Risks discussed:  Bleeding, infection, incomplete drainage and pain   Alternatives discussed:  No treatment Location:    Location:  Knee   Knee:  R knee Anesthesia (see MAR for exact dosages):    Anesthesia method:  Local infiltration   Local anesthetic:  Lidocaine 1% w/o epi Procedure details:    Preparation: Patient was prepped and draped in usual sterile fashion     Needle gauge:  18 G   Approach:  Superior   Aspirate characteristics:  Yellow, serous and blood-tinged   Steroid injected: no   Post-procedure details:    Dressing:  Adhesive bandage   Patient tolerance of procedure:  Tolerated well, no immediate complications Comments:     80+ cc drawn off the knee with significant pain improvement   (including  critical care time)  Medications Ordered in ED Medications  HYDROcodone-acetaminophen (NORCO/VICODIN) 5-325 MG per tablet 1 tablet (has no administration in time range)  ondansetron (ZOFRAN-ODT) disintegrating tablet 4 mg (has no administration in time range)     Initial Impression / Assessment and Plan / ED Course  I have reviewed the triage vital signs and the nursing notes.  Pertinent labs & imaging results that were available during my care of the patient were reviewed by me and considered in my medical decision making (see chart for details).  Clinical Course as of Oct 15 2109  Tue Oct 16, 2018  1820 Patient unable to ambulate with pain meds, walker and knee immobilizer. I have offered arthrocentesis for pain control   [AH]    Clinical Course User Index [  AH] Arthor Captain, PA-C      Here with large knee effusion. Patient was given multiple doses of pain medication, she was given a arthrocentesis for pain improvement.  She was unable to ambulate even with her walker in place.  Patient does have an abnormal rash which may be suggestive of a vasculitis.  Her liver enzymes are elevated I think she will need outpatient follow-up with dermatology and PCP.  I spoke with Dr. Kandice Hams or he will admit the patient for PT OT evaluation. Final Clinical Impressions(s) / ED Diagnoses   Final diagnoses:  None    ED Discharge Orders    None       Arthor Captain, PA-C 10/17/18 1319    Linwood Dibbles, MD 10/17/18 1500

## 2018-10-17 ENCOUNTER — Encounter (HOSPITAL_COMMUNITY): Payer: Self-pay | Admitting: General Practice

## 2018-10-17 ENCOUNTER — Other Ambulatory Visit: Payer: Self-pay

## 2018-10-17 DIAGNOSIS — Z90710 Acquired absence of both cervix and uterus: Secondary | ICD-10-CM | POA: Diagnosis not present

## 2018-10-17 DIAGNOSIS — M25461 Effusion, right knee: Secondary | ICD-10-CM | POA: Diagnosis present

## 2018-10-17 DIAGNOSIS — S8391XA Sprain of unspecified site of right knee, initial encounter: Secondary | ICD-10-CM | POA: Diagnosis present

## 2018-10-17 DIAGNOSIS — Z6837 Body mass index (BMI) 37.0-37.9, adult: Secondary | ICD-10-CM | POA: Diagnosis not present

## 2018-10-17 DIAGNOSIS — R7989 Other specified abnormal findings of blood chemistry: Secondary | ICD-10-CM | POA: Diagnosis present

## 2018-10-17 DIAGNOSIS — W19XXXA Unspecified fall, initial encounter: Secondary | ICD-10-CM | POA: Diagnosis present

## 2018-10-17 DIAGNOSIS — S8991XA Unspecified injury of right lower leg, initial encounter: Secondary | ICD-10-CM | POA: Diagnosis not present

## 2018-10-17 DIAGNOSIS — Z96652 Presence of left artificial knee joint: Secondary | ICD-10-CM | POA: Diagnosis present

## 2018-10-17 DIAGNOSIS — E669 Obesity, unspecified: Secondary | ICD-10-CM | POA: Diagnosis present

## 2018-10-17 DIAGNOSIS — E785 Hyperlipidemia, unspecified: Secondary | ICD-10-CM | POA: Diagnosis present

## 2018-10-17 DIAGNOSIS — M25469 Effusion, unspecified knee: Secondary | ICD-10-CM | POA: Diagnosis present

## 2018-10-17 DIAGNOSIS — M1711 Unilateral primary osteoarthritis, right knee: Secondary | ICD-10-CM | POA: Diagnosis present

## 2018-10-17 DIAGNOSIS — Z880 Allergy status to penicillin: Secondary | ICD-10-CM | POA: Diagnosis not present

## 2018-10-17 DIAGNOSIS — J45909 Unspecified asthma, uncomplicated: Secondary | ICD-10-CM | POA: Diagnosis present

## 2018-10-17 DIAGNOSIS — M25561 Pain in right knee: Secondary | ICD-10-CM | POA: Diagnosis present

## 2018-10-17 DIAGNOSIS — Z90722 Acquired absence of ovaries, bilateral: Secondary | ICD-10-CM | POA: Diagnosis not present

## 2018-10-17 DIAGNOSIS — I10 Essential (primary) hypertension: Secondary | ICD-10-CM | POA: Diagnosis present

## 2018-10-17 DIAGNOSIS — D72829 Elevated white blood cell count, unspecified: Secondary | ICD-10-CM | POA: Diagnosis present

## 2018-10-17 DIAGNOSIS — R21 Rash and other nonspecific skin eruption: Secondary | ICD-10-CM | POA: Diagnosis present

## 2018-10-17 DIAGNOSIS — Z79899 Other long term (current) drug therapy: Secondary | ICD-10-CM | POA: Diagnosis not present

## 2018-10-17 DIAGNOSIS — Z66 Do not resuscitate: Secondary | ICD-10-CM | POA: Diagnosis present

## 2018-10-17 HISTORY — DX: Effusion, right knee: M25.461

## 2018-10-17 LAB — SYNOVIAL CELL COUNT + DIFF, W/ CRYSTALS
Crystals, Fluid: NONE SEEN
Eosinophils-Synovial: 0 % (ref 0–1)
Lymphocytes-Synovial Fld: 1 % (ref 0–20)
Monocyte-Macrophage-Synovial Fluid: 6 % — ABNORMAL LOW (ref 50–90)
Neutrophil, Synovial: 93 % — ABNORMAL HIGH (ref 0–25)
WBC, Synovial: 31800 /mm3 — ABNORMAL HIGH (ref 0–200)

## 2018-10-17 LAB — CBC
HEMATOCRIT: 35 % — AB (ref 36.0–46.0)
Hemoglobin: 10.9 g/dL — ABNORMAL LOW (ref 12.0–15.0)
MCH: 31.2 pg (ref 26.0–34.0)
MCHC: 31.1 g/dL (ref 30.0–36.0)
MCV: 100.3 fL — ABNORMAL HIGH (ref 80.0–100.0)
Platelets: 202 10*3/uL (ref 150–400)
RBC: 3.49 MIL/uL — ABNORMAL LOW (ref 3.87–5.11)
RDW: 12.6 % (ref 11.5–15.5)
WBC: 11.8 10*3/uL — ABNORMAL HIGH (ref 4.0–10.5)
nRBC: 0 % (ref 0.0–0.2)

## 2018-10-17 LAB — CREATININE, SERUM
Creatinine, Ser: 0.96 mg/dL (ref 0.44–1.00)
GFR calc Af Amer: >60 mL/min (ref >60)
GFR calc non Af Amer: 57 mL/min — ABNORMAL LOW (ref >60)

## 2018-10-17 LAB — RAPID HIV SCREEN (HIV 1/2 AB+AG)
HIV 1/2 Antibodies: NONREACTIVE
HIV-1 P24 Antigen - HIV24: NONREACTIVE

## 2018-10-17 MED ORDER — HYDROCODONE-ACETAMINOPHEN 5-325 MG PO TABS
1.0000 | ORAL_TABLET | Freq: Four times a day (QID) | ORAL | Status: DC | PRN
  Administered 2018-10-17 (×2): 2 via ORAL
  Administered 2018-10-19 – 2018-10-20 (×3): 1 via ORAL
  Filled 2018-10-17 (×2): qty 2
  Filled 2018-10-17: qty 1
  Filled 2018-10-17: qty 2
  Filled 2018-10-17: qty 1

## 2018-10-17 MED ORDER — MOMETASONE FURO-FORMOTEROL FUM 200-5 MCG/ACT IN AERO
2.0000 | INHALATION_SPRAY | Freq: Two times a day (BID) | RESPIRATORY_TRACT | Status: DC
  Administered 2018-10-17 – 2018-10-20 (×6): 2 via RESPIRATORY_TRACT
  Filled 2018-10-17: qty 8.8

## 2018-10-17 MED ORDER — PANTOPRAZOLE SODIUM 20 MG PO TBEC
20.0000 mg | DELAYED_RELEASE_TABLET | Freq: Every day | ORAL | Status: DC
  Administered 2018-10-17 – 2018-10-20 (×4): 20 mg via ORAL
  Filled 2018-10-17 (×4): qty 1

## 2018-10-17 MED ORDER — CYANOCOBALAMIN 500 MCG PO TABS
500.0000 ug | ORAL_TABLET | Freq: Every day | ORAL | Status: DC
  Administered 2018-10-17 – 2018-10-20 (×4): 500 ug via ORAL
  Filled 2018-10-17 (×4): qty 1

## 2018-10-17 MED ORDER — MORPHINE SULFATE (PF) 4 MG/ML IV SOLN
4.0000 mg | Freq: Once | INTRAVENOUS | Status: AC
  Administered 2018-10-17: 4 mg via INTRAVENOUS
  Filled 2018-10-17: qty 1

## 2018-10-17 MED ORDER — MORPHINE SULFATE (PF) 4 MG/ML IV SOLN: 4.0000 mg | INTRAVENOUS | Status: DC | PRN

## 2018-10-17 MED ORDER — LIP MEDEX EX OINT
TOPICAL_OINTMENT | CUTANEOUS | Status: AC
  Filled 2018-10-17: qty 7

## 2018-10-17 MED ORDER — ATORVASTATIN CALCIUM 40 MG PO TABS
80.0000 mg | ORAL_TABLET | Freq: Every day | ORAL | Status: DC
  Administered 2018-10-17 – 2018-10-19 (×3): 80 mg via ORAL
  Filled 2018-10-17 (×3): qty 2

## 2018-10-17 MED ORDER — METHYLPREDNISOLONE ACETATE 80 MG/ML IJ SUSP
80.0000 mg | Freq: Once | INTRAMUSCULAR | Status: AC
  Administered 2018-10-17: 80 mg via INTRA_ARTICULAR
  Filled 2018-10-17 (×2): qty 1

## 2018-10-17 MED ORDER — OXYCODONE HCL 5 MG PO TABS
5.0000 mg | ORAL_TABLET | ORAL | Status: DC | PRN
  Administered 2018-10-18 – 2018-10-19 (×4): 5 mg via ORAL
  Filled 2018-10-17 (×4): qty 1

## 2018-10-17 MED ORDER — BUPIVACAINE HCL 0.5 % IJ SOLN
50.0000 mL | Freq: Once | INTRAMUSCULAR | Status: AC
  Administered 2018-10-17: 50 mL
  Filled 2018-10-17: qty 50

## 2018-10-17 MED ORDER — ENOXAPARIN SODIUM 40 MG/0.4ML ~~LOC~~ SOLN
40.0000 mg | SUBCUTANEOUS | Status: DC
  Administered 2018-10-17 – 2018-10-20 (×4): 40 mg via SUBCUTANEOUS
  Filled 2018-10-17 (×4): qty 0.4

## 2018-10-17 MED ORDER — ALBUTEROL SULFATE (2.5 MG/3ML) 0.083% IN NEBU: 2.5000 mg | INHALATION_SOLUTION | RESPIRATORY_TRACT | Status: DC | PRN

## 2018-10-17 MED ORDER — LIDOCAINE HCL 1 % IJ SOLN
10.0000 mL | Freq: Once | INTRAMUSCULAR | Status: AC
  Administered 2018-10-17: 10 mL via INTRADERMAL
  Filled 2018-10-17: qty 10

## 2018-10-17 MED ORDER — ACETAMINOPHEN 500 MG PO TABS: 1000.0000 mg | ORAL_TABLET | Freq: Every day | ORAL | Status: DC | PRN

## 2018-10-17 NOTE — ED Notes (Signed)
ED TO INPATIENT HANDOFF REPORT  ED Nurse Name and Phone #: Jeanice Lim RN [phone]  S Name/Age/Gender Kirsten Horton Comp 78 y.o. female Room/Bed: WHALD/WHALD  Code Status   Code Status: Not on file  Home/SNF/Other Home Patient oriented to: self Is this baseline? Yes   Triage Complete: Triage complete  Chief Complaint Knee Pain  Triage Note Pt states she was walking and had a mechanical fall 2 days ago. States she landed on her R knee. Pt has taken tylenol and iced it at home w/o relief. Alert and oriented. No obvious deformity noted. Some swelling noted.    Allergies Allergies  Allergen Reactions  . Penicillins Hives and Rash    Did it involve swelling of the face/tongue/throat, SOB, or low BP? Yes Did it involve sudden or severe rash/hives, skin peeling, or any reaction on the inside of your mouth or nose? No  Did you need to seek medical attention at a hospital or doctor's office? Yes When did it last happen?more than 10 years If all above answers are "NO", may proceed with cephalosporin use.     Level of Care/Admitting Diagnosis ED Disposition    ED Disposition Condition Comment   Admit  Hospital Area: [facility] [[id number]]  Level of Care: Med-Surg [16]  Diagnosis: Knee effusion [390514]  Admitting Physician: John Giovanni [[id number]]  Attending Physician: John Giovanni [[id number]]  PT Class (Do Not Modify): Observation [104]  PT Acc Code (Do Not Modify): Observation [10022]       B Medical/Surgery History Past Medical History:  Diagnosis Date  . Arthritis   . Asthma    seasonal   Past Surgical History:  Procedure Laterality Date  . ABDOMINAL HYSTERECTOMY    . TOTAL KNEE ARTHROPLASTY  11/30/2011   Procedure: TOTAL KNEE ARTHROPLASTY;  Surgeon: Loreta Ave, MD;  Location: [facility] OR;  Service: Orthopedics;  Laterality: Left;  left total knee arthroplasty     A IV Location/Drains/Wounds Patient Lines/Drains/Airways  Status   Active Line/Drains/Airways    Name:   Placement date:   Placement time:   Site:   Days:   Peripheral IV 10/17/18 Left Antecubital   10/17/18    0003    Antecubital   less than 1   Incision 11/30/11 Knee Left   11/30/11    0926     2513          Intake/Output Last 24 hours No intake or output data in the 24 hours ending 10/17/18 0140  Labs/Imaging Results for orders placed or performed during the hospital encounter of 10/16/18 (from the past 48 hour(s))  Basic metabolic panel     Status: Abnormal   Collection Time: 10/16/18  2:09 PM  Result Value Ref Range   Sodium 137 135 - 145 mmol/L   Potassium 3.5 3.5 - 5.1 mmol/L   Chloride 102 98 - 111 mmol/L   CO2 26 22 - 32 mmol/L   Glucose, Bld 119 (H) 70 - 99 mg/dL   BUN 10 8 - 23 mg/dL   Creatinine, Ser 2.95 0.44 - 1.00 mg/dL   Calcium 8.4 (L) 8.9 - 10.3 mg/dL   GFR calc non Af Amer >60 >60 mL/min   GFR calc Af Amer >60 >60 mL/min   Anion gap 9 5 - 15    Comment: Performed at [facility], [address], [place], [place] [address]  CBC with Differential     Status: Abnormal   Collection Time: 10/16/18  2:09 PM  Result Value Ref Range   WBC 10.4 4.0 - 10.5 K/uL   RBC 3.70 (L) 3.87 - 5.11 MIL/uL   Hemoglobin 11.6 (L) 12.0 - 15.0 g/dL   HCT 95.0 93.2 - 67.1 %   MCV 99.5 80.0 - 100.0 fL   MCH 31.4 26.0 - 34.0 pg   MCHC 31.5 30.0 - 36.0 g/dL   RDW 24.5 80.9 - 98.3 %   Platelets 194 150 - 400 K/uL   nRBC 0.0 0.0 - 0.2 %   Neutrophils Relative % 80 %   Neutro Abs 8.2 (H) 1.7 - 7.7 K/uL   Lymphocytes Relative 10 %   Lymphs Abs 1.1 0.7 - 4.0 K/uL   Monocytes Relative 10 %   Monocytes Absolute 1.1 (H) 0.1 - 1.0 K/uL   Eosinophils Relative 0 %   Eosinophils Absolute 0.0 0.0 - 0.5 K/uL   Basophils Relative 0 %   Basophils Absolute 0.0 0.0 - 0.1 K/uL   Immature Granulocytes 0 %   Abs Immature Granulocytes 0.04 0.00 - 0.07 K/uL    Comment: Performed at [facility], [address] [place], [place] [address]  Hepatic function panel     Status: Abnormal   Collection Time: 10/16/18  2:59 PM  Result Value Ref Range   Total Protein 7.1 6.5 - 8.1 g/dL   Albumin 3.1 (L) 3.5 - 5.0 g/dL   AST 86 (H) 15 - 41 U/L   ALT 113 (H) 0 - 44 U/L   Alkaline Phosphatase 251 (H) 38 - 126 U/L   Total Bilirubin 1.1 0.3 - 1.2 mg/dL   Bilirubin, Direct 0.2 0.0 - 0.2 mg/dL   Indirect Bilirubin 0.9 0.3 - 0.9 mg/dL    Comment: Performed at [facility], [address], [place], [place] [address]  Protime-INR     Status: None   Collection Time: 10/16/18  2:59 PM  Result Value Ref Range   Prothrombin Time 14.7 11.4 - 15.2 seconds   INR 1.2 0.8 - 1.2    Comment: (NOTE) INR goal varies based on device and disease states. Performed at [facility], [address], [place], [place] [address]    Ct Knee Right Wo Contrast  Result Date: 10/16/2018 CLINICAL DATA:  Right knee pain with inability to bear weight. Evaluate for fracture. EXAM: CT OF THE RIGHT  KNEE WITHOUT CONTRAST TECHNIQUE: Multidetector CT imaging of the right knee was performed according to the standard protocol. Multiplanar CT image reconstructions were also generated. COMPARISON:  Right knee x-rays from same day. FINDINGS: Bones/Joint/Cartilage No acute fracture or dislocation. Large joint effusion. No lipohemarthrosis. Moderate to severe medial and lateral compartment joint space narrowing with subchondral sclerosis, cystic change, and large marginal osteophytes. Unchanged lateral subluxation of the tibia with respect to the femur. Osteopenia. Chondrocalcinosis of the menisci. Ligaments Suboptimally assessed by CT. Muscles and Tendons The extensor mechanism is intact.  No muscle atrophy. Soft tissues No fluid collection or hematoma.  No soft tissue mass. IMPRESSION: 1.  No acute osseous abnormality. 2. Large joint effusion.  No lipohemarthrosis. 3. Moderate to severe medial and lateral compartment  osteoarthritis. Likely degenerative lateral subluxation of the tibia. Electronically Signed   By: Obie Dredge M.D.   On: 10/16/2018 16:12   Dg Knee Complete 4 Views Right  Result Date: 10/16/2018 CLINICAL DATA:  Fall with right knee pain EXAM: RIGHT KNEE - COMPLETE 4+ VIEW COMPARISON:  None. FINDINGS: There is mild lateral subluxation of the tibia  relative to the femur. No acute fracture. There are marginal osteophytes at the femorotibial joint, medially and laterally. Mild chondrocalcinosis of the medial femorotibial joint space. There is a large suprapatellar effusion. IMPRESSION: 1. Large suprapatellar effusion without visible acute fracture. 2. Mild lateral subluxation of the tibia relative to the femur. 3. Moderate femorotibial osteoarthrosis. Electronically Signed   By: Deatra RobinsonKevin  Herman M.D.   On: 10/16/2018 14:44    Pending Labs Unresulted Labs (From admission, onward)   None      Vitals/Pain Today's Vitals   10/16/18 2255 10/16/18 2256 10/17/18 0001 10/17/18 0045  BP: (!) 136/58     Pulse: 90     Resp: 16     Temp:      TempSrc:      SpO2: 90%     PainSc:  Asleep 8  0-No pain    Isolation Precautions No active isolations  Medications Medications  HYDROcodone-acetaminophen (NORCO/VICODIN) 5-325 MG per tablet 1-2 tablet (has no administration in time range)  HYDROcodone-acetaminophen (NORCO/VICODIN) 5-325 MG per tablet 1 tablet (1 tablet Oral Given 10/16/18 1407)  ondansetron (ZOFRAN-ODT) disintegrating tablet 4 mg (4 mg Oral Given 10/16/18 1407)  lidocaine (PF) (XYLOCAINE) 1 % injection 30 mL (30 mLs Infiltration Given by Other 10/16/18 1945)  oxyCODONE (Oxy IR/ROXICODONE) immediate release tablet 5 mg (5 mg Oral Given 10/16/18 1941)  morphine 4 MG/ML injection 4 mg (4 mg Intravenous Given 10/17/18 0008)    Mobility non-ambulatory Moderate fall risk   Focused Assessments Other   R Recommendations: See Admitting Provider Note  Report given to:   Additional Notes:  Joint Effusion

## 2018-10-17 NOTE — Consult Note (Addendum)
Reason for Consult:right knee pain s/p fall Referring Physician: Dr Theda Belfast Kirsten Horton is an 78 y.o. female.  HPI: [id number] fell on Sunday   She presented to [facility] yesterday with large effusion unable to ambulate.  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  The ER aspirated her but did not place any steroid in the knee.  Today she is being kept for observation because of her inability to ambulated.   Past Medical History:  Diagnosis Date  . Arthritis   . Asthma    seasonal    Past Surgical History:  Procedure Laterality Date  . ABDOMINAL HYSTERECTOMY    . TOTAL KNEE ARTHROPLASTY  11/30/2011   Procedure: TOTAL KNEE ARTHROPLASTY;  Surgeon: Loreta Ave, MD;  Location: [facility] OR;  Service: Orthopedics;  Laterality: Left;  left total knee arthroplasty    History reviewed. No pertinent family history.  Social History:  reports that she has never smoked. She has never used smokeless tobacco. She reports current alcohol use of about 1.0 standard drinks of alcohol per week. No history on file for drug.  Allergies:  Allergies  Allergen Reactions  . Penicillins Hives and Rash    Did it involve swelling of the face/tongue/throat, SOB, or low BP? Yes Did it involve sudden or severe rash/hives, skin peeling, or any reaction on the inside of your mouth or nose? No  Did you need to seek medical attention at a hospital or doctor's office? Yes When did it last happen?more than 10 years If all above answers are "NO", may proceed with cephalosporin use.     Medications:  Prior to Admission:  Medications Prior to Admission  Medication Sig Dispense Refill Last Dose  . acetaminophen (TYLENOL) 500 MG tablet Take 1,000 mg by mouth daily as needed for moderate pain.   10/16/2018 at Unknown time  . albuterol (PROVENTIL HFA;VENTOLIN HFA) 108 (90 BASE) MCG/ACT inhaler Inhale 2 puffs into the lungs every 6 (six) hours as needed. For shortness of  breath   10/15/2018 at Unknown time  . CALCIUM PO Take by mouth.   10/15/2018 at Unknown time  . Cyanocobalamin (VITAMIN B 12 PO) Take 1 tablet by mouth daily.   10/15/2018 at Unknown time  . diclofenac sodium (VOLTAREN) 1 % GEL Apply 2 g topically 4 (four) times daily as needed. For knee pain   10/15/2018 at Unknown time  . Fluticasone-Salmeterol (ADVAIR) 250-50 MCG/DOSE AEPB Inhale 1 puff into the lungs 2 (two) times daily.   10/15/2018 at Unknown time  . latanoprost (XALATAN) 0.005 % ophthalmic solution Place 1 drop into both eyes at bedtime.  11 10/15/2018 at Unknown time  . LIPITOR 80 MG tablet Take 80 mg by mouth daily.  3 10/15/2018 at Unknown time  . VITAMIN D PO Take by mouth.   10/15/2018 at Unknown time  . ondansetron (ZOFRAN ODT) 4 MG disintegrating tablet 4mg  ODT q4 hours prn nausea/vomit (Patient not taking: Reported on 10/16/2018) 8 tablet 0 Not Taking at Unknown time  . pantoprazole (PROTONIX) 20 MG tablet Take 1 tablet (20 mg total) by mouth daily. (Patient not taking: Reported on 10/16/2018) 30 tablet 0 Not Taking at Unknown time  . traMADol (ULTRAM) 50 MG tablet Take 1 tablet (50 mg total) by mouth every 6 (six) hours as needed. (Patient not taking: Reported on 10/16/2018) 15 tablet 0 Not Taking at Unknown time    Results for orders placed or performed during the hospital encounter  of 10/16/18 (from the past 48 hour(s))  Basic metabolic panel     Status: Abnormal   Collection Time: 10/16/18  2:09 PM  Result Value Ref Range   Sodium 137 135 - 145 mmol/L   Potassium 3.5 3.5 - 5.1 mmol/L   Chloride 102 98 - 111 mmol/L   CO2 26 22 - 32 mmol/L   Glucose, Bld 119 (H) 70 - 99 mg/dL   BUN 10 8 - 23 mg/dL   Creatinine, Ser 1.61 0.44 - 1.00 mg/dL   Calcium 8.4 (L) 8.9 - 10.3 mg/dL   GFR calc non Af Amer >60 >60 mL/min   GFR calc Af Amer >60 >60 mL/min   Anion gap 9 5 - 15    Comment: Performed at [facility], [address] [place], [place] [address]  CBC with Differential      Status: Abnormal   Collection Time: 10/16/18  2:09 PM  Result Value Ref Range   WBC 10.4 4.0 - 10.5 K/uL   RBC 3.70 (L) 3.87 - 5.11 MIL/uL   Hemoglobin 11.6 (L) 12.0 - 15.0 g/dL   HCT 54.0 98.1 - 19.1 %   MCV 99.5 80.0 - 100.0 fL   MCH 31.4 26.0 - 34.0 pg   MCHC 31.5 30.0 - 36.0 g/dL   RDW 47.8 29.5 - 62.1 %   Platelets 194 150 - 400 K/uL   nRBC 0.0 0.0 - 0.2 %   Neutrophils Relative % 80 %   Neutro Abs 8.2 (H) 1.7 - 7.7 K/uL   Lymphocytes Relative 10 %   Lymphs Abs 1.1 0.7 - 4.0 K/uL   Monocytes Relative 10 %   Monocytes Absolute 1.1 (H) 0.1 - 1.0 K/uL   Eosinophils Relative 0 %   Eosinophils Absolute 0.0 0.0 - 0.5 K/uL   Basophils Relative 0 %   Basophils Absolute 0.0 0.0 - 0.1 K/uL   Immature Granulocytes 0 %   Abs Immature Granulocytes 0.04 0.00 - 0.07 K/uL    Comment: Performed at [facility], [address] [place], [place] [address]  Hepatic function panel     Status: Abnormal   Collection Time: 10/16/18  2:59 PM  Result Value Ref Range   Total Protein 7.1 6.5 - 8.1 g/dL   Albumin 3.1 (L) 3.5 - 5.0 g/dL   AST 86 (H) 15 - 41 U/L   ALT 113 (H) 0 - 44 U/L   Alkaline Phosphatase 251 (H) 38 - 126 U/L   Total Bilirubin 1.1 0.3 - 1.2 mg/dL   Bilirubin, Direct 0.2 0.0 - 0.2 mg/dL   Indirect Bilirubin 0.9 0.3 - 0.9 mg/dL    Comment: Performed at [facility], [address], [place], [place] [address]  Protime-INR     Status: None   Collection Time: 10/16/18  2:59 PM  Result Value Ref Range   Prothrombin Time 14.7 11.4 - 15.2 seconds   INR 1.2 0.8 - 1.2    Comment: (NOTE) INR goal varies based on device and disease states. Performed at [facility], [address] [place], [place] [address]   CBC     Status: Abnormal   Collection Time: 10/17/18 10:04 AM  Result Value Ref Range   WBC 11.8 (H) 4.0 - 10.5 K/uL   RBC 3.49 (L) 3.87 - 5.11 MIL/uL   Hemoglobin 10.9 (L) 12.0 - 15.0 g/dL   HCT 84.1 (L) 32.4 - 40.1  %   MCV 100.3 (H) 80.0 -  100.0 fL   MCH 31.2 26.0 - 34.0 pg   MCHC 31.1 30.0 - 36.0 g/dL   RDW 16.112.6 09.611.5 - 04.515.5 %   Platelets 202 150 - 400 K/uL   nRBC 0.0 0.0 - 0.2 %    Comment: Performed at [facility], [address] [place], [place] [address]  Creatinine, serum     Status: Abnormal   Collection Time: 10/17/18 10:04 AM  Result Value Ref Range   Creatinine, Ser 0.96 0.44 - 1.00 mg/dL   GFR calc non Af Amer 57 (L) >60 mL/min   GFR calc Af Amer >60 >60 mL/min    Comment: Performed at [facility], [address] [place], [place] [address]    Ct Knee Right Wo Contrast  Result Date: 10/16/2018 CLINICAL DATA:  Right knee pain with inability to bear weight. Evaluate for fracture. EXAM: CT OF THE RIGHT  KNEE WITHOUT CONTRAST TECHNIQUE: Multidetector CT imaging of the right knee was performed according to the standard protocol. Multiplanar CT image reconstructions were also generated. COMPARISON:  Right knee x-rays from same day. FINDINGS: Bones/Joint/Cartilage No acute fracture or dislocation. Large joint effusion. No lipohemarthrosis. Moderate to severe medial and lateral compartment joint space narrowing with subchondral sclerosis, cystic change, and large marginal osteophytes. Unchanged lateral subluxation of the tibia with respect to the femur. Osteopenia. Chondrocalcinosis of the menisci. Ligaments Suboptimally assessed by CT. Muscles and Tendons The extensor mechanism is intact.  No muscle atrophy. Soft tissues No fluid collection or hematoma.  No soft tissue mass. IMPRESSION: 1.  No acute osseous abnormality. 2. Large joint effusion.  No lipohemarthrosis. 3. 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 Likely degenerative lateral subluxation of the tibia. Electronically Signed   By: Obie DredgeWilliam T Derry M.D.   On: 10/16/2018 16:12   Dg Knee Complete 4 Views Right  Result Date: 10/16/2018 CLINICAL DATA:  Fall with right knee pain EXAM:  RIGHT KNEE - COMPLETE 4+ VIEW COMPARISON:  None. FINDINGS: There is mild lateral subluxation of the tibia relative to the femur. No acute fracture. There are marginal osteophytes at the femorotibial joint, medially and laterally. Mild chondrocalcinosis of the medial femorotibial joint space. There is a large suprapatellar effusion. IMPRESSION: 1. Large suprapatellar effusion without visible acute fracture. 2. Mild lateral subluxation of the tibia relative to the femur. 3. Moderate femorotibial osteoarthrosis. Electronically Signed   By: Deatra RobinsonKevin  Herman M.D.   On: 10/16/2018 14:44    Review of Systems  Constitutional: Negative.   Eyes: Negative.   Respiratory: Negative.   Cardiovascular: Negative.   Genitourinary: Negative.   Musculoskeletal: Positive for back pain and joint pain.  Skin: Negative.   Neurological: Negative.   Endo/Heme/Allergies: Negative.   Psychiatric/Behavioral: Negative.    Blood pressure 124/66, pulse 75, temperature 98.4 F (36.9 C), temperature source Oral, resp. rate 16, height 5' (1.524 m), weight 87.1 kg, SpO2 97 %. Physical Exam  Constitutional: She is oriented to person, place, and time. She appears well-developed and well-nourished.  HENT:  Head: Normocephalic and atraumatic.  Eyes: Pupils are equal, round, and reactive to light. Conjunctivae and EOM are normal.  Neck: Neck supple.  Cardiovascular: Normal rate.  Respiratory: Breath sounds normal.  GI: Bowel sounds are normal.  Genitourinary:    Genitourinary Comments: Not pertinent to current symptomatology therefore not examined.   Musculoskeletal:     Comments: Right knee still significantly swollen despite aspiration in ER.  No steroid was put in the knee when aspirated.  Active ROM 0-30.  She is able to do a straight leg raise   Ambulation with therapy was very difficult.  DNVI  Neurological: She is alert and oriented to person, place, and time.  Skin: Skin is dry.  Psychiatric: She has a normal mood and  affect. Her behavior is normal.    Assessment Active Problems:   Knee effusion   Knee effusion, right    Plan: Patient is still having difficulty mobilizing after significant fall on her severely arthritic right knee.  Will reaspirate and inject with 80 depomedrol and xylocaine and marcaine.  I have spoken to the [employer] to order a hinged neoprene brace that will allow full range of motion but give support to her degenerative subluxation.  Will follow.     Aspirated 45 cc of bloody cloudy joint fluid, injected with 80 mg depomedrol, 2 cc xylocaine, 2 cc marcaine.  Of note she does have a red petechial rash all over upper and lower extremities that she has had since her fall.  I have order cell count, crystal, gram stain, culture and sensitivity on the knee joint fluid.    Alaisa Moffitt J Etoile Looman 10/17/2018, 4:00 PM

## 2018-10-17 NOTE — Evaluation (Signed)
Physical Therapy Evaluation Patient Details Name: Kirsten Horton MRN: [id number] DOB: 16-Jun-1941 Today's Date: 10/17/2018   History of Present Illness  Kirsten Horton is a 78 y.o. female.  Who presents the emergency department chief complaint of right knee joint effusion s/p fall on steps. Imaging - for acute fracture, + for suspected degenerative lateral subluxation of tibia. PMH includes OA, asthma, L TKR.   Clinical Impression   Pt presents with R knee pain, decreased R knee ROM/strength, difficulty performing mobility tasks, and decreased endurance for ambulation due to R knee pain. Pt to benefit from acute PT to address deficits. Pt ambulated short room distance, limited by antalgic gait. Pt with very little WB on RLE due to pain. Pt with KI donned during ambulation. PT recommending HHPT to address deficits. PT to progress mobility as tolerated, and will continue to follow acutely.      Follow Up Recommendations Home health PT;Supervision for mobility/OOB    Equipment Recommendations  None recommended by PT    Recommendations for Other Services       Precautions / Restrictions Precautions Precaution Comments: no WB listed, pt WB ~25% on RLE during 10 ft ambulation in room  Required Braces or Orthoses: Knee Immobilizer - Right Knee Immobilizer - Right: Other (comment)(unsure, no order for KI in notes at this time ) Restrictions Weight Bearing Restrictions: No      Mobility  Bed Mobility Overal bed mobility: Needs Assistance Bed Mobility: Sit to Supine       Sit to supine: Supervision;HOB elevated   General bed mobility comments: pt in chair upon PT arrival to room. Supervision for safety for sit to supine, increased time and use of bed rails and HOB elevation.  Transfers Overall transfer level: Needs assistance Equipment used: Rolling walker (2 wheeled) Transfers: Sit to/from Stand Sit to Stand: Min assist Stand pivot transfers: Min assist        General transfer comment: Min assist for power up, pt with increased time to come to standing. Pt stood mostly with LLE, offweighting RLE.   Ambulation/Gait Ambulation/Gait assistance: Min guard Gait Distance (Feet): 10 Feet Assistive device: Rolling walker (2 wheeled) Gait Pattern/deviations: Step-to pattern;Decreased stance time - right;Decreased step length - right;Antalgic;Trunk flexed Gait velocity: decr    General Gait Details: Min guard for safety. Pt significantly offweighting RLE due to pain, and only able to walk short room distance due to R knee pain.  Stairs Stairs: (NT - pt in high pain with ambulation )          Wheelchair Mobility    Modified Rankin (Stroke Patients Only)       Balance                                             Pertinent Vitals/Pain Pain Assessment: 0-10 Pain Score: 4  Pain Location: R knee  Pain Descriptors / Indicators: Sore Pain Intervention(s): Limited activity within patient's tolerance;Monitored during session;Ice applied    Home Living Family/patient expects to be discharged to:: Private residence Living Arrangements: Children(Pt's son lives with her ) Available Help at Discharge: Family Type of Home: House Home Access: Stairs to enter Entrance Stairs-Rails: Left;Right;Can reach both Secretary/administrator of Steps: 3 Home Layout: One level Home Equipment: Environmental consultant - 2 wheels;Tub bench;Grab bars - toilet Additional Comments: Pt reports not needing AD PTA, reports falling on stairs when  taking groceries in.     Prior Function Level of Independence: Independent         Comments: Pt works part time for [employer], with individuals with special needs.      Hand Dominance   Dominant Hand: Right    Extremity/Trunk Assessment   Upper Extremity Assessment Upper Extremity Assessment: Defer to OT evaluation    Lower Extremity Assessment Lower Extremity Assessment: RLE  deficits/detail;Generalized weakness RLE Deficits / Details: Pain and application of KI limiting exam; PT removed KI for seated and reclined portion of exam to assess swelling, available ROM, and quad strength. Pt able to perform weak quad set limited by pain. ROM limited to ~10* knee flexion due to pain.  RLE: Unable to fully assess due to immobilization;Unable to fully assess due to pain    Cervical / Trunk Assessment Cervical / Trunk Assessment: Normal  Communication   Communication: No difficulties  Cognition Arousal/Alertness: Awake/alert Behavior During Therapy: WFL for tasks assessed/performed Overall Cognitive Status: Within Functional Limits for tasks assessed                                 General Comments: pt very pleasant       General Comments      Exercises     Assessment/Plan    PT Assessment Patient needs continued PT services  PT Problem List Decreased strength;Decreased mobility;Decreased range of motion;Decreased activity tolerance;Decreased balance;Decreased knowledge of use of DME       PT Treatment Interventions DME instruction;Functional mobility training;Gait training;Therapeutic activities;Stair training;Therapeutic exercise;Patient/family education;Balance training    PT Goals (Current goals can be found in the Care Plan section)  Acute Rehab PT Goals Patient Stated Goal: decrease pain  PT Goal Formulation: With patient Time For Goal Achievement: 10/31/18 Potential to Achieve Goals: Good    Frequency Min 3X/week   Barriers to discharge        Co-evaluation               AM-PAC PT "6 Clicks" Mobility  Outcome Measure Help needed turning from your back to your side while in a flat bed without using bedrails?: A Little Help needed moving from lying on your back to sitting on the side of a flat bed without using bedrails?: A Little Help needed moving to and from a bed to a chair (including a wheelchair)?: A Little Help  needed standing up from a chair using your arms (e.g., wheelchair or bedside chair)?: A Little Help needed to walk in hospital room?: A Little Help needed climbing 3-5 steps with a railing? : A Lot 6 Click Score: 17    End of Session Equipment Utilized During Treatment: Gait belt;Right knee immobilizer Activity Tolerance: Patient limited by pain Patient left: in bed;with call bell/phone within reach Nurse Communication: Mobility status PT Visit Diagnosis: Other abnormalities of gait and mobility (R26.89);Difficulty in walking, not elsewhere classified (R26.2)    Time: 6237-6283 PT Time Calculation (min) (ACUTE ONLY): 16 min   Charges:   PT Evaluation $PT Eval Low Complexity: 1 Low         Nicola Police, PT Acute Rehabilitation Services Pager [phone]  Office [phone]  Tyrone Apple D Despina Hidden 10/17/2018, 6:24 PM

## 2018-10-17 NOTE — H&P (Addendum)
HPI  Kirsten Horton ZOX:[id number] DOB: October 25, 1940 DOA: 10/16/2018  PCP: Gaspar Garbe, MD   Chief Complaint: Fall and knee pain  HPI:  78 year old African-American female Prior left total knee replacement HTN HLD Asthma Status post TAH/BSO  Admitted to [facility] long after having a fall while on stairs falling backward and then knee buckling underneath her Experienced immediate pain and swelling Had arthrocentesis in the emergency room Placed in knee immobilizer   ED Course: Given pain meds, arthrocentesis performed  Review of Systems:  Negative for fever, visual changes, sore throat, rash, new muscle aches, chest pain, SOB, dysuria, bleeding, n/v/abdominal pain.  Past Medical History:  Diagnosis Date  . Arthritis   . Asthma    seasonal    Past Surgical History:  Procedure Laterality Date  . ABDOMINAL HYSTERECTOMY    . TOTAL KNEE ARTHROPLASTY  11/30/2011   Procedure: TOTAL KNEE ARTHROPLASTY;  Surgeon: Loreta Ave, MD;  Location: [facility] OR;  Service: Orthopedics;  Laterality: Left;  left total knee arthroplasty     reports that she has never smoked. She has never used smokeless tobacco. She reports current alcohol use of about 1.0 standard drinks of alcohol per week. No history on file for drug. Mobility: Independent at baseline  Allergies  Allergen Reactions  . Penicillins Hives and Rash    Did it involve swelling of the face/tongue/throat, SOB, or low BP? Yes Did it involve sudden or severe rash/hives, skin peeling, or any reaction on the inside of your mouth or nose? No  Did you need to seek medical attention at a hospital or doctor's office? Yes When did it last happen?more than 10 years If all above answers are "NO", may proceed with cephalosporin use.     No family history on file.   Prior to Admission medications   Medication Sig Start Date End Date Taking? Authorizing Provider  acetaminophen (TYLENOL) 500 MG tablet Take 1,000 mg  by mouth daily as needed for moderate pain.   Yes [provider]  albuterol (PROVENTIL HFA;VENTOLIN HFA) 108 (90 BASE) MCG/ACT inhaler Inhale 2 puffs into the lungs every 6 (six) hours as needed. For shortness of breath   Yes [provider]  CALCIUM PO Take by mouth.   Yes [provider]  Cyanocobalamin (VITAMIN B 12 PO) Take 1 tablet by mouth daily.   Yes [provider]  diclofenac sodium (VOLTAREN) 1 % GEL Apply 2 g topically 4 (four) times daily as needed. For knee pain 09/05/18  Yes [provider]  Fluticasone-Salmeterol (ADVAIR) 250-50 MCG/DOSE AEPB Inhale 1 puff into the lungs 2 (two) times daily.   Yes [provider]  latanoprost (XALATAN) 0.005 % ophthalmic solution Place 1 drop into both eyes at bedtime. 02/26/15  Yes [provider]  LIPITOR 80 MG tablet Take 80 mg by mouth daily. 03/02/15  Yes [provider]  VITAMIN D PO Take by mouth.   Yes [provider]  ondansetron (ZOFRAN ODT) 4 MG disintegrating tablet 4mg  ODT q4 hours prn nausea/vomit Patient not taking: Reported on 10/16/2018 04/24/15   Loren Racer, MD  pantoprazole (PROTONIX) 20 MG tablet Take 1 tablet (20 mg total) by mouth daily. Patient not taking: Reported on 10/16/2018 04/24/15   Loren Racer, MD  traMADol (ULTRAM) 50 MG tablet Take 1 tablet (50 mg total) by mouth every 6 (six) hours as needed. Patient not taking: Reported on 10/16/2018 04/24/15   Loren Racer, MD    Physical Exam:  Vitals:   10/17/18 0211 10/17/18 0441  BP: 133/62 123/63  Pulse: 98 (!) 103  Resp: (!) 24 16  Temp: 99.2 F (37.3 C) 98.4 F (36.9 C)  SpO2: 90% (!) 89%     Awake alert pleasant thick neck arcus senilis no icterus no pallor  Chest clinically clear no added sound  Abdomen soft nontender no rebound no guarding  Neurologically intact moving all 4 limbs other than right lower extremity which is to be immobilizer  Neurovascularly intact   Anterior drawer's test shows significant pain and tenderness on performance of the same posterior drawer's test negative McMurray's test is equivocal  I have personally reviewed following labs and imaging studies  Labs:   Be met normal except for serum glucose 119  Alk phos 251 AST/ALT 86/1 1 3   Hemoglobin 11.6 platelet 194    Imaging studies:   Knee x-ray shows large suprapatellar effusion without fracture  CT scan of knee showed no acute large osseous abnormality moderate to severe medial lateral compartment osteoarthritis slightly degenerative lateral subluxation of tibia  Medical tests:   EKG independently reviewed: n    Test discussed with performing physician:  n   Decision to obtain old records:   n   Review and summation of old records:   n   Active Problems:   Knee effusion   Assessment/Plan Right knee pain Concern raised for ligamentous injury ACL-I have called [facility] to see the patient patient may require an MRI For moderate pain we will keep her on Percocet 12/09/2023 every 4 as needed and for severe pain IV morphine up to 4 mg every 2 as needed I will defer weightbearing management at this time although she will require PT Addend-spoke with Dr. 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 Inpatient status is judged to be reasonable and necessary in order to provide the required intensity of service to ensure the  patient's safety. The patient's presenting symptoms, physical exam findings, and initial radiographic and laboratory data in the context of their chronic comorbidities is felt to place them at high risk for further clinical deterioration. Furthermore, it is not anticipated that the patient will be medically stable for discharge from the hospital within 2 midnights of admission. The following factors support the patient status of inpatient.   " The patient's presenting symptoms include severe knee pain. " The worrisome physical exam findings include effusion and tenderness on testing. " The initial radiographic and laboratory data are worrisome because of concerns for fracture and ligamentous tear. " The chronic co-morbidities include obesity.   * I certify that at the point of admission it is my clinical judgment that the patient will require inpatient hospital care spanning beyond 2 midnights from the point of admission due to high intensity of service, high risk for further deterioration and high frequency of surveillance required.*     DVT prophylaxis: Lovenox Code Status: DNR confirmed Family Communication: Daughter Consults called: Orthopedics  Time spent: 45 minutes  Mahala Menghini, MD  [facility] Direct  contact: [phone] --Via amion app OR  --[email or web address]; password [email or web address]  7PM-7AM contact night coverage as above  10/17/2018, 9:24 AM

## 2018-10-17 NOTE — Evaluation (Signed)
Occupational Therapy Evaluation Patient Details Name: Kirsten Horton [facility] MRN: [id number] DOB: Apr 14, 1941 Today's Date: 10/17/2018    History of Present Illness Kirsten Horton is a 78 y.o. female.  Who presents the emergency department chief complaint of right knee injury.  Patient states that he days ago she was walking down her steps when she planted her right knee and twisted.  She had immediate severe pain and fell onto the right knee with her leg in flexion underneath her   Clinical Impression   Pt admitted s/p a fall with knee pain. Pt currently with functional limitations due to the deficits listed below (see OT Problem List).  Pt will benefit from skilled OT to increase their safety and independence with ADL and functional mobility for ADL to facilitate discharge to venue listed below.   Per MD noted Ortho consulted.  No WB status at this time.  OT used [facility] to get pt to [facility]   Pt reports putting weight on RLe to get to chair with RN     Follow Up Recommendations  Home health OT;Supervision - Intermittent    Equipment Recommendations  None recommended by OT    Recommendations for Other Services       Precautions / Restrictions Precautions Precaution Comments: No WB status ordered - will ask for clarification.   Required Braces or Orthoses: Knee Immobilizer - Right      Mobility Bed Mobility               General bed mobility comments: pt in chair  Transfers Overall transfer level: Needs assistance Equipment used: Rolling walker (2 wheeled) Transfers: Sit to/from Stand;Stand Pivot Transfers Sit to Stand: Min assist;Mod assist Stand pivot transfers: Min assist                ADL either performed or assessed with clinical judgement   ADL Overall ADL's : Needs assistance/impaired Eating/Feeding: Set up;Sitting   Grooming: Set up;Sitting   Upper Body Bathing: Set up;Sitting   Lower Body Bathing: Moderate assistance;Sit to/from  stand;Cueing for sequencing;Cueing for safety   Upper Body Dressing : Set up;Sitting   Lower Body Dressing: Moderate assistance;Sit to/from stand;Cueing for sequencing;Cueing for safety   Toilet Transfer: Stand-pivot;RW;BSC;Cueing for safety;Cueing for sequencing   Toileting- Clothing Manipulation and Hygiene: Sit to/from stand;Cueing for sequencing;Cueing for safety               Vision Patient Visual Report: No change from baseline              Pertinent Vitals/Pain Pain Assessment: No/denies pain     Hand Dominance     Extremity/Trunk Assessment Upper Extremity Assessment Upper Extremity Assessment: Generalized weakness           Communication Communication Communication: No difficulties   Cognition Arousal/Alertness: Awake/alert Behavior During Therapy: WFL for tasks assessed/performed Overall Cognitive Status: Within Functional Limits for tasks assessed                                                Home Living Family/patient expects to be discharged to:: Private residence Living Arrangements: Children   Type of Home: House Home Access: Stairs to enter Secretary/administrator of Steps: 3   Home Layout: One level     Bathroom Shower/Tub: IT trainer: Handicapped height Bathroom Accessibility: Yes   Home Equipment:  Walker - 2 wheels;Tub bench;Grab bars - toilet          Prior Functioning/Environment Level of Independence: Independent                 OT Problem List: Decreased strength      OT Treatment/Interventions: Self-care/ADL training;Patient/family education;Therapeutic activities    OT Goals(Current goals can be found in the care plan section) Acute Rehab OT Goals Patient Stated Goal: get well  OT Goal Formulation: With patient Time For Goal Achievement: 10/31/18 Potential to Achieve Goals: Good ADL Goals Pt Will Perform Lower Body Bathing: (P) with supervision;sit to/from  stand Pt Will Perform Lower Body Dressing: (P) with supervision;sit to/from stand Pt Will Transfer to Toilet: (P) with supervision;bedside commode;regular height toilet Pt Will Perform Toileting - Clothing Manipulation and hygiene: (P) with supervision;sit to/from stand  OT Frequency: Min 2X/week    AM-PAC OT "6 Clicks" Daily Activity     Outcome Measure Help from another person eating meals?: None Help from another person taking care of personal grooming?: None Help from another person toileting, which includes using toliet, bedpan, or urinal?: A Little Help from another person bathing (including washing, rinsing, drying)?: A Little Help from another person to put on and taking off regular upper body clothing?: None Help from another person to put on and taking off regular lower body clothing?: A Little 6 Click Score: 21   End of Session Equipment Utilized During Treatment: Rolling walker;Right knee immobilizer;Gait belt Nurse Communication: Mobility status  Activity Tolerance: Patient tolerated treatment well Patient left: in chair;with call bell/phone within reach  OT Visit Diagnosis: Unsteadiness on feet (R26.81);Repeated falls (R29.6);Muscle weakness (generalized) (M62.81);History of falling (Z91.81)                Time: 8413-2440 OT Time Calculation (min): 24 min Charges:  OT General Charges $OT Visit: 1 Visit OT Evaluation $OT Eval Low Complexity: 1 Low OT Treatments $Self Care/Home Management : 8-22 mins  Lise Auer, OT Acute Rehabilitation Services [phone] Office- [phone]     Dorena Dorfman, Kirsten Horton 10/17/2018, 3:05 PM

## 2018-10-18 LAB — COMPREHENSIVE METABOLIC PANEL
ALT: 72 U/L — AB (ref 0–44)
AST: 47 U/L — ABNORMAL HIGH (ref 15–41)
Albumin: 3 g/dL — ABNORMAL LOW (ref 3.5–5.0)
Alkaline Phosphatase: 225 U/L — ABNORMAL HIGH (ref 38–126)
Anion gap: 9 (ref 5–15)
BUN: 19 mg/dL (ref 8–23)
CO2: 29 mmol/L (ref 22–32)
CREATININE: 0.84 mg/dL (ref 0.44–1.00)
Calcium: 8.7 mg/dL — ABNORMAL LOW (ref 8.9–10.3)
Chloride: 98 mmol/L (ref 98–111)
GFR calc Af Amer: >60 mL/min (ref >60)
Glucose, Bld: 167 mg/dL — ABNORMAL HIGH (ref 70–99)
Potassium: 4.5 mmol/L (ref 3.5–5.1)
Sodium: 136 mmol/L (ref 135–145)
Total Bilirubin: 0.8 mg/dL (ref 0.3–1.2)
Total Protein: 7.5 g/dL (ref 6.5–8.1)

## 2018-10-18 LAB — CBC
HCT: 36.2 % (ref 36.0–46.0)
Hemoglobin: 11.3 g/dL — ABNORMAL LOW (ref 12.0–15.0)
MCH: 31.6 pg (ref 26.0–34.0)
MCHC: 31.2 g/dL (ref 30.0–36.0)
MCV: 101.1 fL — ABNORMAL HIGH (ref 80.0–100.0)
Platelets: 222 10*3/uL (ref 150–400)
RBC: 3.58 MIL/uL — ABNORMAL LOW (ref 3.87–5.11)
RDW: 12.7 % (ref 11.5–15.5)
WBC: 14.3 10*3/uL — ABNORMAL HIGH (ref 4.0–10.5)
nRBC: 0 % (ref 0.0–0.2)

## 2018-10-18 LAB — HIV ANTIBODY (ROUTINE TESTING W REFLEX): HIV Screen 4th Generation wRfx: NONREACTIVE

## 2018-10-18 MED ORDER — DICLOFENAC SODIUM 1 % TD GEL: 2.0000 g | Freq: Four times a day (QID) | TRANSDERMAL | Status: DC | PRN

## 2018-10-18 MED ORDER — LATANOPROST 0.005 % OP SOLN
1.0000 [drp] | Freq: Every day | OPHTHALMIC | Status: DC
  Administered 2018-10-18 – 2018-10-19 (×2): 1 [drp] via OPHTHALMIC
  Filled 2018-10-18: qty 2.5

## 2018-10-18 NOTE — Progress Notes (Signed)
Occupational Therapy Treatment Patient Details Name: Kirsten Horton [facility] MRN: [id number] DOB: 05/21/1941 Today's Date: 10/18/2018    History of present illness Kirsten Horton is a 78 y.o. female.  Who presents the emergency department chief complaint of right knee joint effusion s/p fall on steps. Imaging - for acute fracture, + for suspected degenerative lateral subluxation of tibia. PMH includes OA, asthma, L TKR.    OT comments  Pt much improved this day.  P  Follow Up Recommendations  Home health OT;Supervision - Intermittent    Equipment Recommendations  None recommended by OT    Recommendations for Other Services      Precautions / Restrictions Pt with new knee brace RLE. Pt states it feels very supportive  No WB status listed.  Used walker and brace walking to BR.        Mobility Bed Mobility Overal bed mobility: Needs Assistance Bed Mobility: Sit to Supine       Sit to supine: Supervision;HOB elevated      Transfers Overall transfer level: Needs assistance Equipment used: Rolling walker (2 wheeled) Transfers: Sit to/from [employer] Sit to Stand: Min assist Stand pivot transfers: Min assist                ADL either performed or assessed with clinical judgement   ADL Overall ADL's : Needs assistance/impaired     Grooming: Standing;Set up           Upper Body Dressing : Set up;Sitting   Lower Body Dressing: Minimal assistance;Sit to/from stand;Cueing for sequencing;Cueing for safety   Toilet Transfer: Ambulation;Comfort height toilet;RW;Min guard   Toileting- Clothing Manipulation and Hygiene: Min guard;Sit to/from stand;Cueing for sequencing;Cueing for safety       Functional mobility during ADLs: Min guard General ADL Comments: pt much improved this day.       Vision Patient Visual Report: No change from baseline            Cognition Arousal/Alertness: Awake/alert Behavior During Therapy: WFL  for tasks assessed/performed Overall Cognitive Status: Within Functional Limits for tasks assessed                                 General Comments: pt very pleasant                    Pertinent Vitals/ Pain       Pain Score: 3  Pain Location: R knee  Pain Descriptors / Indicators: Sore Pain Intervention(s): Limited activity within patient's tolerance;Repositioned         Frequency  Min 2X/week        Progress Toward Goals  OT Goals(current goals can now be found in the care plan section)  Progress towards OT goals: Progressing toward goals     Plan Discharge plan remains appropriate       AM-PAC OT "6 Clicks" Daily Activity     Outcome Measure   Help from another person eating meals?: None Help from another person taking care of personal grooming?: None Help from another person toileting, which includes using toliet, bedpan, or urinal?: A Little Help from another person bathing (including washing, rinsing, drying)?: A Little Help from another person to put on and taking off regular upper body clothing?: None Help from another person to put on and taking off regular lower body clothing?: A Little 6 Click Score: 21    End of Session  Equipment Utilized During Treatment: Rolling walker;Right knee immobilizer;Gait belt  OT Visit Diagnosis: Unsteadiness on feet (R26.81);Repeated falls (R29.6);Muscle weakness (generalized) (M62.81);History of falling (Z91.81)   Activity Tolerance Patient tolerated treatment well   Patient Left in chair;with call bell/phone within reach   Nurse Communication Mobility status        Time: 1610-9604 OT Time Calculation (min): 12 min  Charges: OT General Charges $OT Visit: 1 Visit OT Treatments $Self Care/Home Management : 8-22 mins  Lise Auer, OT Acute Rehabilitation Services [phone] Office- [phone]      Zykera Abella, [name] 10/18/2018, 4:01 PM

## 2018-10-18 NOTE — Progress Notes (Signed)
PROGRESS NOTE    Kirsten Horton  NWG:[id number] DOB: 02-25-1941 DOA: 10/16/2018 PCP: Gaspar Garbe, MD    Brief Narrative:  78 year old African-American female Prior left total knee replacement HTN, HLD Asthma, Status post TAH/BSO, Admitted to [facility] long after having a fall while on stairs falling backward and then knee buckling underneath her.Had arthrocentesis in the emergency room. Placed in knee immobilizer. Orthopedics consulted and recommendations given. Worked with PT today.   Assessment & Plan:   Active Problems:   Knee effusion   Knee effusion, right   Fall with right knee effusion: S/p arthrocentesis.  Fluid sent for analysis.  Pain control and PTe val  Appreciate ortho recommendations.    Rash, leukocytosis and elevated liver function panel.  Unclear etiology.  Pt denies any fever.  Rash appears to be improving.     hypertension Well controlled.    Asthma.  No wheezing heard.       DVT prophylaxis: lovenox.  Code Status: full code.  Family Communication: none at bedside.  Disposition Plan: possible d/c in the next 24 hours.    Consultants:   Orthopedics.   Procedures:  Arthrocentesis   Antimicrobials: none.   Subjective: Knee pain is improving.   Objective: Vitals:   10/17/18 1402 10/17/18 2138 10/18/18 0554 10/18/18 1427  BP: 124/66 135/78 (!) 183/83 (!) 154/67  Pulse: 75 88 70 82  Resp: 16 (!) 21 18 19   Temp:  98.9 F (37.2 C) 98.3 F (36.8 C) 98.3 F (36.8 C)  TempSrc:  Oral  Oral  SpO2: 97% 95% 98% 97%  Weight:      Height:       No intake or output data in the 24 hours ending 10/18/18 1847 Filed Weights   10/17/18 1205  Weight: 87.1 kg    Examination:  General exam: Appears calm and comfortable  Respiratory system: Clear to auscultation. Respiratory effort normal. Cardiovascular system: S1 & S2 heard, RRR. Gastrointestinal system: Abdomen is nondistended, soft and nontender. No organomegaly or  masses felt. Normal bowel sounds heard. Central nervous system: Alert and oriented. No focal neurological deficits. Extremities: right leg in immobilizer.  Skin: rash on her lower extremities.  Psychiatry:Mood & affect appropriate.     Data Reviewed: I have personally reviewed following labs and imaging studies  CBC: Recent Labs  Lab 10/16/18 1409 10/17/18 1004 10/18/18 0650  WBC 10.4 11.8* 14.3*  NEUTROABS 8.2*  --   --   HGB 11.6* 10.9* 11.3*  HCT 36.8 35.0* 36.2  MCV 99.5 100.3* 101.1*  PLT 194 202 222   Basic Metabolic Panel: Recent Labs  Lab 10/16/18 1409 10/17/18 1004 10/18/18 0650  NA 137  --  136  K 3.5  --  4.5  CL 102  --  98  CO2 26  --  29  GLUCOSE 119*  --  167*  BUN 10  --  19  CREATININE 0.73 0.96 0.84  CALCIUM 8.4*  --  8.7*   GFR: Estimated Creatinine Clearance: 54.1 mL/min (by C-G formula based on SCr of 0.84 mg/dL). Liver Function Tests: Recent Labs  Lab 10/16/18 1459 10/18/18 0650  AST 86* 47*  ALT 113* 72*  ALKPHOS 251* 225*  BILITOT 1.1 0.8  PROT 7.1 7.5  ALBUMIN 3.1* 3.0*   No results for input(s): LIPASE, AMYLASE in the last 168 hours. No results for input(s): AMMONIA in the last 168 hours. Coagulation Profile: Recent Labs  Lab 10/16/18 1459  INR 1.2   Cardiac Enzymes:  No results for input(s): CKTOTAL, CKMB, CKMBINDEX, TROPONINI in the last 168 hours. BNP (last 3 results) No results for input(s): PROBNP in the last 8760 hours. HbA1C: No results for input(s): HGBA1C in the last 72 hours. CBG: No results for input(s): GLUCAP in the last 168 hours. Lipid Profile: No results for input(s): CHOL, HDL, LDLCALC, TRIG, CHOLHDL, LDLDIRECT in the last 72 hours. Thyroid Function Tests: No results for input(s): TSH, T4TOTAL, FREET4, T3FREE, THYROIDAB in the last 72 hours. Anemia Panel: No results for input(s): VITAMINB12, FOLATE, FERRITIN, TIBC, IRON, RETICCTPCT in the last 72 hours. Sepsis Labs: No results for input(s):  PROCALCITON, LATICACIDVEN in the last 168 hours.  Recent Results (from the past 240 hour(s))  Body fluid culture     Status: None (Preliminary result)   Collection Time: 10/17/18  4:50 PM  Result Value Ref Range Status   Specimen Description   Final    KNEE RIGHT Performed at [facility], [address] [place], [place] [address]    Special Requests   Final    NONE Performed at [facility], [address], [place], [place] [address]    Gram Stain   Final    ABUNDANT WBC PRESENT, PREDOMINANTLY PMN NO ORGANISMS SEEN Gram Stain Report Called to,Read Back By and Verified With: D.GORE AT 1916 ON 10/17/18 BY N.THOMPSON Performed at [facility], [address], [place], [place] [address]    Culture   Final    NO GROWTH < 12 HOURS Performed at [facility], [address], [place], [place] [address]    Report Status PENDING  Incomplete  Gonococcus culture     Status: None (Preliminary result)   Collection Time: 10/17/18  4:50 PM  Result Value Ref Range Status   Specimen Description   Final    KNEE RIGHT Performed at [facility], [address], [place], [place] [address]    Special Requests   Final    NONE Performed at [facility], [address], [place], [place] [address]    Culture   Final    NO GROWTH < 12 HOURS Performed at [facility], [address], [place], [place] [address]    Report Status PENDING  Incomplete         Radiology Studies: No results found.      Scheduled Meds: . atorvastatin  80 mg Oral Daily  . enoxaparin (LOVENOX) injection  40 mg Subcutaneous Q24H  . latanoprost  1 drop Both Eyes QHS  . mometasone-formoterol  2 puff Inhalation BID  . pantoprazole  20 mg Oral Daily  . vitamin B-12  500 mcg Oral Daily   Continuous Infusions:   LOS: 1 day    Time spent: 28 min    Kathlen Mody, MD [facility] Pager [phone]  If 7PM-7AM, please contact night-coverage [email or web address] Password [facility] 10/18/2018, 6:47 PM

## 2018-10-19 NOTE — Care Management Important Message (Signed)
Important Message  Patient Details  Name: Oksana Alles MRN: [id number] Date of Birth: Sep 01, 1940   Medicare Important Message Given:  Yes    Caren Macadam 10/19/2018, 11:44 AMImportant Message  Patient Details  Name: Juliamarie Plachy MRN: [id number] Date of Birth: 1941/05/11   Medicare Important Message Given:  Yes    Caren Macadam 10/19/2018, 11:43 AM

## 2018-10-19 NOTE — Progress Notes (Signed)
Subjective: Patient improving with ambulation.  Still painful but injection and brace helped   Objective: Vital signs in last 24 hours: Temp:  [97.5 F (36.4 C)-98.2 F (36.8 C)] 98.2 F (36.8 C) (03/13 1407) Pulse Rate:  [66-71] 71 (03/13 1407) Resp:  [17-19] 17 (03/13 1407) BP: (146-149)/(60-67) 148/67 (03/13 1407) SpO2:  [93 %-98 %] 96 % (03/13 1407)  Intake/Output from previous day: 03/12 0701 - 03/13 0700 In: 240 [P.O.:240] Out: -  Intake/Output this shift: No intake/output data recorded.  Recent Labs    10/17/18 1004 10/18/18 0650  HGB 10.9* 11.3*   Recent Labs    10/17/18 1004 10/18/18 0650  WBC 11.8* 14.3*  RBC 3.49* 3.58*  HCT 35.0* 36.2  PLT 202 222   Recent Labs    10/17/18 1004 10/18/18 0650  NA  --  136  K  --  4.5  CL  --  98  CO2  --  29  BUN  --  19  CREATININE 0.96 0.84  GLUCOSE  --  167*  CALCIUM  --  8.7*   No results for input(s): LABPT, INR in the last 72 hours.  Right knee still swollen with diffuse pain, but improving slowly.      Assessment Inflammatory synovitis right knee with acute traumatic fall Primary  Localized osteoarthritis right knee History of left total knee by Dr Richardson Landry   Plan: Agree with medicine's plan for discharge in the am.  Will have this patient follow up with Dr Farris Has at [facility].  Appointment time 3pm Monday March 16     Kirsten Horton Kirsten Horton 10/19/2018, 7:20 PM

## 2018-10-19 NOTE — Progress Notes (Signed)
Physical Therapy Treatment Patient Details Name: Kirsten Horton MRN: [id number] DOB: 01/10/1941 Today's Date: 10/19/2018    History of Present Illness Kirsten Horton is a 78 y.o. female who presents the emergency department chief complaint of right knee joint effusion s/p fall on steps. Imaging negative for acute fracture, + for suspected degenerative lateral subluxation of tibia. PMH includes OA, asthma, L TKR.     PT Comments    Pt ambulated into hallway however distance limited by pain increase to 6/10 in R knee and fatigue.  Pt reports the new brace is helping control her pain better.  Pt hopeful for d/c home soon.  Follow Up Recommendations  Home health PT;Supervision for mobility/OOB     Equipment Recommendations  None recommended by PT    Recommendations for Other Services       Precautions / Restrictions Precautions Precautions: Fall Precaution Comments: no WB listed Required Braces or Orthoses: Other Brace Other Brace: "hinged neoprene brace that will allow full range of motion but give support to her degenerative subluxation" - per ortho note    Mobility  Bed Mobility               General bed mobility comments: pt in chair upon PT arrival to room  Transfers Overall transfer level: Needs assistance Equipment used: Rolling walker (2 wheeled) Transfers: Sit to/from Stand Sit to Stand: Min guard         General transfer comment: pt utilizes armrests to self assist  Ambulation/Gait Ambulation/Gait assistance: Min guard Gait Distance (Feet): 26 Feet Assistive device: Rolling walker (2 wheeled) Gait Pattern/deviations: Step-to pattern;Decreased stance time - right;Antalgic Gait velocity: decr    General Gait Details: verbal cues for sequence, RW positioning and weight bearing with UEs for pain control   Stairs             Wheelchair Mobility    Modified Rankin (Stroke Patients Only)       Balance                                            Cognition Arousal/Alertness: Awake/alert Behavior During Therapy: WFL for tasks assessed/performed Overall Cognitive Status: Within Functional Limits for tasks assessed                                 General Comments: pt very pleasant       Exercises      General Comments        Pertinent Vitals/Pain Pain Assessment: 0-10 Pain Score: 4  Pain Location: R knee  Pain Descriptors / Indicators: Sore Pain Intervention(s): Premedicated before session;Repositioned;Monitored during session    Home Living                      Prior Function            PT Goals (current goals can now be found in the care plan section) Progress towards PT goals: Progressing toward goals    Frequency           PT Plan Current plan remains appropriate    Co-evaluation              AM-PAC PT "6 Clicks" Mobility   Outcome Measure  Help needed turning from your back to your side while in  a flat bed without using bedrails?: A Little Help needed moving from lying on your back to sitting on the side of a flat bed without using bedrails?: A Little Help needed moving to and from a bed to a chair (including a wheelchair)?: A Little Help needed standing up from a chair using your arms (e.g., wheelchair or bedside chair)?: A Little Help needed to walk in hospital room?: A Little Help needed climbing 3-5 steps with a railing? : A Lot 6 Click Score: 17    End of Session Equipment Utilized During Treatment: Gait belt Activity Tolerance: Patient limited by fatigue;Patient limited by pain Patient left: with call bell/phone within reach;in chair Nurse Communication: Mobility status PT Visit Diagnosis: Difficulty in walking, not elsewhere classified (R26.2)     Time: 1914-7829 PT Time Calculation (min) (ACUTE ONLY): 16 min  Charges:  $Gait Training: 8-22 mins                     Zenovia Jarred, PT, DPT Acute Rehabilitation  Services Office: [phone] Pager: [phone]  Sarajane Jews 10/19/2018, 1:03 PM

## 2018-10-19 NOTE — Progress Notes (Signed)
PROGRESS NOTE    Kirsten Horton  GUY:[id number] DOB: [birth year] DOA: 10/16/2018 PCP: Gaspar Garbe, MD    Brief Narrative:  78 year old African-American female Prior left total knee replacement HTN, HLD Asthma, Status post TAH/BSO, Admitted to [facility] long after having a fall while on stairs falling backward and then knee buckling underneath her.Had arthrocentesis in the emergency room. Placed in knee immobilizer. Orthopedics consulted and recommendations given, plan outpatient follow up. Worked with PT today.   Assessment & Plan:   Active Problems:   Knee effusion   Knee effusion, right   Fall with right knee effusion: S/p arthrocentesis.  Fluid sent for analysis. Cultures are pending. Pt reports she is unable to bear weight, on her knee while walking with PT and feels not ready to go home yet.  Pain control with IV morphine and norco every 2 to 6 hours prn.  and PT eval recommending home health PT  And walker.  Appreciate ortho recommendations.  Follow up with Orthopedics on Monday.    Rash, leukocytosis and elevated liver function panel.  Unclear etiology.  Pt denies any fever. Liver enzymes improving. D/c lipitor.  Rash appears to have resolved.     hypertension Well controlled.    Asthma.  No wheezing heard.    Leukocytosis:  - possibly from steroids.    DVT prophylaxis: lovenox.  Code Status: full code.  Family Communication: none at bedside.  Disposition Plan: possible d/c in the next 24 hours.    Consultants:   Orthopedics.   Procedures:  Arthrocentesis   Antimicrobials: none.   Subjective: Knee pain is improving but still in a lot of pain. Wants to go home tomorrow.     Objective: Vitals:   10/18/18 2145 10/19/18 0542 10/19/18 1055 10/19/18 1407  BP:  (!) 146/66  (!) 148/67  Pulse:  66  71  Resp:  19  17  Temp:  (!) 97.5 F (36.4 C)  98.2 F (36.8 C)  TempSrc:  Oral  Oral  SpO2: 96% 96% 93% 96%  Weight:      Height:         Intake/Output Summary (Last 24 hours) at 10/19/2018 1543 Last data filed at 10/19/2018 0900 Gross per 24 hour  Intake 480 ml  Output -  Net 480 ml   Filed Weights   10/17/18 1205  Weight: 87.1 kg    Examination:  General exam: mild distress from knee pain.  Respiratory system: Clear to auscultation. Respiratory effort normal. No wheezing or rhonchi. .  Cardiovascular system: S1 & S2 heard, RRR. Gastrointestinal system: Abdomen is nondistended, soft and nontender. No organomegaly or masses felt. Normal bowel sounds heard. Central nervous system: Alert and oriented. No focal neurological deficits. Extremities: right leg in immobilizer. Persistent knee swelling and tenderness.  Skin: rash on her lower extremities.  Psychiatry:Mood & affect appropriate.      Data Reviewed: I have personally reviewed following labs and imaging studies  CBC: Recent Labs  Lab 10/16/18 1409 10/17/18 1004 10/18/18 0650  WBC 10.4 11.8* 14.3*  NEUTROABS 8.2*  --   --   HGB 11.6* 10.9* 11.3*  HCT 36.8 35.0* 36.2  MCV 99.5 100.3* 101.1*  PLT 194 202 222   Basic Metabolic Panel: Recent Labs  Lab 10/16/18 1409 10/17/18 1004 10/18/18 0650  NA 137  --  136  K 3.5  --  4.5  CL 102  --  98  CO2 26  --  29  GLUCOSE 119*  --  167*  BUN 10  --  19  CREATININE 0.73 0.96 0.84  CALCIUM 8.4*  --  8.7*   GFR: Estimated Creatinine Clearance: 54.1 mL/min (by C-G formula based on SCr of 0.84 mg/dL). Liver Function Tests: Recent Labs  Lab 10/16/18 1459 10/18/18 0650  AST 86* 47*  ALT 113* 72*  ALKPHOS 251* 225*  BILITOT 1.1 0.8  PROT 7.1 7.5  ALBUMIN 3.1* 3.0*   No results for input(s): LIPASE, AMYLASE in the last 168 hours. No results for input(s): AMMONIA in the last 168 hours. Coagulation Profile: Recent Labs  Lab 10/16/18 1459  INR 1.2   Cardiac Enzymes: No results for input(s): CKTOTAL, CKMB, CKMBINDEX, TROPONINI in the last 168 hours. BNP (last 3 results) No results for  input(s): PROBNP in the last 8760 hours. HbA1C: No results for input(s): HGBA1C in the last 72 hours. CBG: No results for input(s): GLUCAP in the last 168 hours. Lipid Profile: No results for input(s): CHOL, HDL, LDLCALC, TRIG, CHOLHDL, LDLDIRECT in the last 72 hours. Thyroid Function Tests: No results for input(s): TSH, T4TOTAL, FREET4, T3FREE, THYROIDAB in the last 72 hours. Anemia Panel: No results for input(s): VITAMINB12, FOLATE, FERRITIN, TIBC, IRON, RETICCTPCT in the last 72 hours. Sepsis Labs: No results for input(s): PROCALCITON, LATICACIDVEN in the last 168 hours.  Recent Results (from the past 240 hour(s))  Body fluid culture     Status: None (Preliminary result)   Collection Time: 10/17/18  4:50 PM  Result Value Ref Range Status   Specimen Description   Final    KNEE RIGHT Performed at [facility], [address], [place], [place] [address]    Special Requests   Final    NONE Performed at [facility], [address], [place], [place] [address]    Gram Stain   Final    ABUNDANT WBC PRESENT, PREDOMINANTLY PMN NO ORGANISMS SEEN Gram Stain Report Called to,Read Back By and Verified With: D.GORE AT 1916 ON 10/17/18 BY N.THOMPSON Performed at [facility], [address], [place], [place] [address]    Culture   Final    NO GROWTH 2 DAYS Performed at [facility], [address] [place], [place] [address]    Report Status PENDING  Incomplete  Gonococcus culture     Status: None (Preliminary result)   Collection Time: 10/17/18  4:50 PM  Result Value Ref Range Status   Specimen Description   Final    KNEE RIGHT Performed at [facility], [address], [place], [place] [address]    Special Requests   Final    NONE Performed at [facility], [address] [place], [place] [address]    Culture   Final    NO GROWTH 2 DAYS Performed at [facility],  [address] [place], [place] [address]    Report Status PENDING  Incomplete  Anaerobic culture     Status: None (Preliminary result)   Collection Time: 10/17/18  4:50 PM  Result Value Ref Range Status   Specimen Description   Final    KNEE RIGHT Performed at [facility], [address], [place], [place] [address]    Special Requests   Final    NONE Performed at [facility], [address] [place], [place] [address]    Culture   Final    NO GROWTH 2 DAYS Performed at [facility], [address], [place], [place] [address]    Report Status PENDING  Incomplete         Radiology Studies: No results found.      Scheduled Meds: . atorvastatin  80 mg Oral Daily  . enoxaparin (LOVENOX) injection  40 mg Subcutaneous Q24H  . latanoprost  1 drop Both Eyes QHS  . mometasone-formoterol  2 puff Inhalation BID  . pantoprazole  20 mg Oral Daily  . vitamin B-12  500 mcg Oral Daily   Continuous Infusions:   LOS: 2 days    Time spent:26 minutes.     Kathlen Mody, MD [facility] Pager [phone] If 7PM-7AM, please contact night-coverage [email or web address] Password [facility] 10/19/2018, 3:43 PM

## 2018-10-20 LAB — HEPATIC FUNCTION PANEL
ALT: 58 U/L — ABNORMAL HIGH (ref 0–44)
AST: 55 U/L — ABNORMAL HIGH (ref 15–41)
Albumin: 2.8 g/dL — ABNORMAL LOW (ref 3.5–5.0)
Alkaline Phosphatase: 200 U/L — ABNORMAL HIGH (ref 38–126)
Bilirubin, Direct: <0.1 mg/dL (ref 0.0–0.2)
Total Bilirubin: 0.4 mg/dL (ref 0.3–1.2)
Total Protein: 6.9 g/dL (ref 6.5–8.1)

## 2018-10-20 LAB — GONOCOCCUS CULTURE: Culture: NO GROWTH

## 2018-10-20 LAB — CBC WITH DIFFERENTIAL/PLATELET
Abs Immature Granulocytes: 0.12 10*3/uL — ABNORMAL HIGH (ref 0.00–0.07)
BASOS PCT: 0 %
Basophils Absolute: 0 10*3/uL (ref 0.0–0.1)
EOS ABS: 0 10*3/uL (ref 0.0–0.5)
Eosinophils Relative: 0 %
HCT: 36.8 % (ref 36.0–46.0)
Hemoglobin: 11.3 g/dL — ABNORMAL LOW (ref 12.0–15.0)
Immature Granulocytes: 1 %
Lymphocytes Relative: 15 %
Lymphs Abs: 1.8 10*3/uL (ref 0.7–4.0)
MCH: 30.7 pg (ref 26.0–34.0)
MCHC: 30.7 g/dL (ref 30.0–36.0)
MCV: 100 fL (ref 80.0–100.0)
Monocytes Absolute: 1.1 10*3/uL — ABNORMAL HIGH (ref 0.1–1.0)
Monocytes Relative: 9 %
Neutro Abs: 9.4 10*3/uL — ABNORMAL HIGH (ref 1.7–7.7)
Neutrophils Relative %: 75 %
Platelets: 322 10*3/uL (ref 150–400)
RBC: 3.68 MIL/uL — ABNORMAL LOW (ref 3.87–5.11)
RDW: 12.7 % (ref 11.5–15.5)
WBC: 12.6 10*3/uL — ABNORMAL HIGH (ref 4.0–10.5)
nRBC: 0 % (ref 0.0–0.2)

## 2018-10-21 LAB — BODY FLUID CULTURE: Culture: NO GROWTH

## 2018-10-22 LAB — ANAEROBIC CULTURE

## 2018-10-26 NOTE — Discharge Summary (Signed)
Physician Discharge Summary  Kirsten Horton KVQ:[id number] DOB: Mar 19, 1941 DOA: 10/16/2018  PCP: Gaspar Garbe, MD  Admit date: 10/16/2018 Discharge date: 10/20/2018  Admitted From: Home.  Disposition:  Home   Recommendations for Outpatient Follow-up:  1. Follow up with PCP in 1-2 weeks Please obtain BMP/CBC in one week Please follow up with orthopedics as recommended.    Discharge Condition:stable.  CODE STATUS: full code Diet recommendation: Heart Healthy    Brief/Interim Summary:  78 year old African-American female Prior left total knee replacement HTN, HLD Asthma, Status post TAH/BSO, Admitted to [facility] long after having a fall while on stairs falling backward and then knee buckling underneath her.Had arthrocentesis in the emergency room. Placed in knee immobilizer. Orthopedics consulted and recommendations given, plan outpatient follow up. Worked with PT today.    Discharge Diagnoses:  Active Problems:   Knee effusion   Knee effusion, right  Fall with right knee effusion: S/p arthrocentesis.  Fluid sent for analysis. Cultures are pending. Pt reports she is unable to bear weight, on her knee while walking with PT and feels not ready to go home yet.  Pain control with IV morphine and norco every 2 to 6 hours prn.  and PT eval recommending home health PT  And walker.  Appreciate ortho recommendations.  Follow up with Orthopedics on Monday.    Rash, leukocytosis and elevated liver function panel.  Unclear etiology.  Pt denies any fever. Liver enzymes improving. D/c lipitor  Rash appears to have resolved.     hypertension Well controlled.    Asthma.  No wheezing heard.    Leukocytosis:  - possibly from steroids  Discharge Instructions  Discharge Instructions    Diet - low sodium heart healthy   Complete by:  As directed    Discharge instructions   Complete by:  As directed    Follow up with orthopedics as recommended on Monday.      Allergies as of 10/20/2018      Reactions   Penicillins Hives, Rash   Did it involve swelling of the face/tongue/throat, SOB, or low BP? Yes Did it involve sudden or severe rash/hives, skin peeling, or any reaction on the inside of your mouth or nose? No Did you need to seek medical attention at a hospital or doctor's office? Yes When did it last happen?more than 10 years If all above answers are "NO", may proceed with cephalosporin use.      Medication List    STOP taking these medications   Lipitor 80 MG tablet Generic drug:  atorvastatin   ondansetron 4 MG disintegrating tablet Commonly known as:  Zofran ODT   pantoprazole 20 MG tablet Commonly known as:  PROTONIX     TAKE these medications   acetaminophen 500 MG tablet Commonly known as:  TYLENOL Take 1,000 mg by mouth daily as needed for moderate pain.   albuterol 108 (90 Base) MCG/ACT inhaler Commonly known as:  PROVENTIL HFA;VENTOLIN HFA Inhale 2 puffs into the lungs every 6 (six) hours as needed. For shortness of breath   CALCIUM PO Take by mouth.   diclofenac sodium 1 % Gel Commonly known as:  VOLTAREN Apply 2 g topically 4 (four) times daily as needed. For knee pain   Fluticasone-Salmeterol 250-50 MCG/DOSE Aepb Commonly known as:  ADVAIR Inhale 1 puff into the lungs 2 (two) times daily.   latanoprost 0.005 % ophthalmic solution Commonly known as:  XALATAN Place 1 drop into both eyes at bedtime.   traMADol 50 MG  tablet Commonly known as:  ULTRAM Take 1 tablet (50 mg total) by mouth every 6 (six) hours as needed.   VITAMIN B 12 PO Take 1 tablet by mouth daily.   VITAMIN D PO Take by mouth.      Follow-up Information    [name], MD Follow up on 10/22/2018.   Specialty:  Sports Medicine Why:  appointment time 3pm Contact information: [address] [place] [address] [phone]        [name], [name], MD. Schedule an appointment as soon as possible for  a visit in 1 week(s).   Specialty:  Internal Medicine Why:  for rash on her back  Contact information: [address] [place] [address] [phone]          Allergies  Allergen Reactions  . Penicillins Hives and Rash    Did it involve swelling of the face/tongue/throat, SOB, or low BP? Yes Did it involve sudden or severe rash/hives, skin peeling, or any reaction on the inside of your mouth or nose? No  Did you need to seek medical attention at a hospital or doctor's office? Yes When did it last happen?more than 10 years If all above answers are "NO", may proceed with cephalosporin use.     Consultations:  None.    Procedures/Studies: Ct Knee Right Wo Contrast  Result Date: 10/16/2018 CLINICAL DATA:  Right knee pain with inability to bear weight. Evaluate for fracture. EXAM: CT OF THE RIGHT  KNEE WITHOUT CONTRAST TECHNIQUE: Multidetector CT imaging of the right knee was performed according to the standard protocol. Multiplanar CT image reconstructions were also generated. COMPARISON:  Right knee x-rays from same day. FINDINGS: Bones/Joint/Cartilage No acute fracture or dislocation. Large joint effusion. No lipohemarthrosis. Moderate to severe medial and lateral compartment joint space narrowing with subchondral sclerosis, cystic change, and large marginal osteophytes. Unchanged lateral subluxation of the tibia with respect to the femur. Osteopenia. Chondrocalcinosis of the menisci. Ligaments Suboptimally assessed by CT. Muscles and Tendons The extensor mechanism is intact.  No muscle atrophy. Soft tissues No fluid collection or hematoma.  No soft tissue mass. IMPRESSION: 1.  No acute osseous abnormality. 2. Large joint effusion.  No lipohemarthrosis. 3. 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 Likely degenerative lateral subluxation of the tibia. Electronically Signed   By: Obie Dredge M.D.   On: 10/16/2018 16:12   Dg Knee Complete 4 Views  Right  Result Date: 10/16/2018 CLINICAL DATA:  Fall with right knee pain EXAM: RIGHT KNEE - COMPLETE 4+ VIEW COMPARISON:  None. FINDINGS: There is mild lateral subluxation of the tibia relative to the femur. No acute fracture. There are marginal osteophytes at the femorotibial joint, medially and laterally. Mild chondrocalcinosis of the medial femorotibial joint space. There is a large suprapatellar effusion. IMPRESSION: 1. Large suprapatellar effusion without visible acute fracture. 2. Mild lateral subluxation of the tibia relative to the femur. 3. Moderate femorotibial osteoarthrosis. Electronically Signed   By: Deatra Robinson M.D.   On: 10/16/2018 14:44       Subjective: NO CHEST PAIN.   Discharge Exam: Vitals:   10/19/18 2218 10/20/18 0806  BP: (!) 152/55 (!) 159/76  Pulse: 78 70  Resp: 20 20  Temp: 98.3 F (36.8 C) 98.2 F (36.8 C)  SpO2: 99% 100%   Vitals:   10/19/18 2127 10/19/18 2218 10/19/18 2218 10/20/18 0806  BP:  (!) 152/55 (!) 152/55 (!) 159/76  Pulse:  80 78 70  Resp:  20 20 20   Temp:  98.3 F (36.8 C) 98.3 F (36.8 C) 98.2 F (36.8 C)  TempSrc:  Oral Oral Oral  SpO2: 96% 97% 99% 100%  Weight:      Height:        General: Pt is alert, awake, not in acute distress Cardiovascular: RRR, S1/S2 +, no rubs, no gallops Respiratory: CTA bilaterally, no wheezing, no rhonchi Abdominal: Soft, NT, ND, bowel sounds + Extremities: no edema, no cyanosis    The results of significant diagnostics from this hospitalization (including imaging, microbiology, ancillary and laboratory) are listed below for reference.     Microbiology: Recent Results (from the past 240 hour(s))  Body fluid culture     Status: None   Collection Time: 10/17/18  4:50 PM  Result Value Ref Range Status   Specimen Description   Final    KNEE RIGHT Performed at [facility], [address] [place], [place] [address]    Special Requests   Final    NONE Performed at [facility], [address] [place], [place] [address]    Gram Stain   Final    ABUNDANT WBC PRESENT, PREDOMINANTLY PMN NO ORGANISMS SEEN Gram Stain Report Called to,Read Back By and Verified With: D.GORE AT 1916 ON 10/17/18 BY N.THOMPSON Performed at [facility], [address], [place], [place] [address]    Culture   Final    NO GROWTH 3 DAYS Performed at [facility], [address], [place], [place] [address]    Report Status 10/21/2018 FINAL  Final  Gonococcus culture     Status: None   Collection Time: 10/17/18  4:50 PM  Result Value Ref Range Status   Specimen Description   Final    KNEE RIGHT Performed at [facility], [address], [place], [place] [address]    Special Requests   Final    NONE Performed at [facility], [address], [place], [place] [address]    Culture   Final    NO GROWTH 3 DAYS Performed at [facility], [address], [place], [place] [address]    Report Status 10/20/2018 FINAL  Final  Anaerobic culture     Status: None   Collection Time: 10/17/18  4:50 PM  Result Value Ref Range Status   Specimen Description   Final    KNEE RIGHT Performed at [facility], [address], [place], [place] [address]    Special Requests   Final    NONE Performed at [facility], [address] [place], [place] [address]    Culture   Final    NO ANAEROBES ISOLATED Performed at [facility], [address], [place], [place] [address]    Report Status 10/22/2018 FINAL  Final     Labs: BNP (last 3 results) No results for input(s): BNP in the last 8760 hours. Basic Metabolic Panel: No results for input(s): NA, K, CL, CO2, GLUCOSE, BUN, CREATININE, CALCIUM, MG, PHOS in the last 168 hours. Liver Function Tests: Recent Labs  Lab 10/20/18 1136  AST 55*  ALT 58*  ALKPHOS 200*  BILITOT 0.4  PROT 6.9  ALBUMIN 2.8*   No  results for input(s): LIPASE, AMYLASE in the last 168 hours. No results for input(s): AMMONIA in the last 168 hours. CBC: Recent Labs  Lab 10/20/18 1136  WBC 12.6*  NEUTROABS 9.4*  HGB 11.3*  HCT 36.8  MCV 100.0  PLT 322   Cardiac Enzymes: No results for input(s): CKTOTAL, CKMB, CKMBINDEX, TROPONINI in the last 168 hours. BNP: Invalid input(s): POCBNP CBG: No results for input(s): GLUCAP in the last 168 hours. D-Dimer No results for input(s): DDIMER in the last 72 hours. Hgb A1c No results for input(s): HGBA1C in the last 72 hours. Lipid Profile No results for input(s): CHOL, HDL, LDLCALC, TRIG, CHOLHDL, LDLDIRECT in the last 72 hours. Thyroid function studies No results for input(s): TSH, T4TOTAL, T3FREE, THYROIDAB in the last 72 hours.  Invalid input(s): FREET3 Anemia work up No results for input(s): VITAMINB12, FOLATE, FERRITIN, TIBC, IRON, RETICCTPCT in the last 72 hours. Urinalysis    Component Value Date/Time   COLORURINE YELLOW 04/24/2015 0031   APPEARANCEUR CLEAR 04/24/2015 0031   LABSPEC 1.018 04/24/2015 0031   PHURINE 6.0 04/24/2015 0031   GLUCOSEU NEGATIVE 04/24/2015 0031   HGBUR TRACE (A) 04/24/2015 0031   BILIRUBINUR NEGATIVE 04/24/2015 0031   KETONESUR 15 (A) 04/24/2015 0031   PROTEINUR NEGATIVE 04/24/2015 0031   UROBILINOGEN 1.0 04/24/2015 0031   NITRITE NEGATIVE 04/24/2015 0031   LEUKOCYTESUR NEGATIVE 04/24/2015 0031   Sepsis Labs Invalid input(s): PROCALCITONIN,  WBC,  LACTICIDVEN Microbiology Recent Results (from the past 240 hour(s))  Body fluid culture     Status: None   Collection Time: 10/17/18  4:50 PM  Result Value Ref Range Status   Specimen Description   Final    KNEE RIGHT Performed at [facility], [address] [place], [place] [address]    Special Requests   Final    NONE Performed at [facility], [address] [place], [place] [address]    Gram Stain   Final    ABUNDANT WBC  PRESENT, PREDOMINANTLY PMN NO ORGANISMS SEEN Gram Stain Report Called to,Read Back By and Verified With: D.GORE AT 1916 ON 10/17/18 BY N.THOMPSON Performed at [facility], [address] [place], [place] [address]    Culture   Final    NO GROWTH 3 DAYS Performed at [facility], [address] [place], [place] [address]    Report Status 10/21/2018 FINAL  Final  Gonococcus culture     Status: None   Collection Time: 10/17/18  4:50 PM  Result Value Ref Range Status   Specimen Description   Final    KNEE RIGHT Performed at [facility], [address] [place], [place] [address]    Special Requests   Final    NONE Performed at [facility], [address], [place], [place] [address]    Culture   Final    NO GROWTH 3 DAYS Performed at [facility], [address], [place], [place] [address]    Report Status 10/20/2018 FINAL  Final  Anaerobic culture     Status: None   Collection Time: 10/17/18  4:50 PM  Result Value Ref Range Status   Specimen Description   Final    KNEE RIGHT Performed at [facility], [address], [place], [place] [address]    Special Requests   Final    NONE Performed at [facility], [address] [place], [place] [address]    Culture   Final    NO ANAEROBES ISOLATED Performed at [facility], [address], [place], [place] [address]    Report Status 10/22/2018 FINAL  Final     Time  coordinating discharge: 32 minutes  SIGNED:   Kathlen Mody, MD  [facility] 10/26/2018, 9:07 AM Pager   If 7PM-7AM, please contact night-coverage [email or web address] Password [email or web address]

## 2019-01-12 ENCOUNTER — Other Ambulatory Visit: Payer: Self-pay | Admitting: *Deleted

## 2019-01-12 DIAGNOSIS — Z20822 Contact with and (suspected) exposure to covid-19: Secondary | ICD-10-CM

## 2019-01-14 LAB — NOVEL CORONAVIRUS, NAA: SARS-CoV-2, NAA: NOT DETECTED

## 2019-08-27 ENCOUNTER — Other Ambulatory Visit: Payer: Self-pay | Admitting: Internal Medicine

## 2019-08-27 DIAGNOSIS — N644 Mastodynia: Secondary | ICD-10-CM

## 2019-09-13 ENCOUNTER — Other Ambulatory Visit: Payer: Self-pay | Admitting: Internal Medicine

## 2019-09-13 DIAGNOSIS — N644 Mastodynia: Secondary | ICD-10-CM

## 2019-10-03 ENCOUNTER — Ambulatory Visit
Admission: RE | Admit: 2019-10-03 | Discharge: 2019-10-03 | Disposition: A | Discharge status: Home / Self Care | Payer: Medicare Other | Source: Ambulatory Visit | Attending: Internal Medicine | Admitting: Internal Medicine

## 2019-10-03 ENCOUNTER — Ambulatory Visit
Admission: RE | Admit: 2019-10-03 | Discharge: 2019-10-03 | Disposition: A | Discharge status: Home / Self Care | Payer: Medicare PPO | Source: Ambulatory Visit | Attending: Internal Medicine | Admitting: Internal Medicine

## 2019-10-03 ENCOUNTER — Other Ambulatory Visit: Payer: Self-pay

## 2019-10-03 DIAGNOSIS — N644 Mastodynia: Secondary | ICD-10-CM

## 2019-11-01 ENCOUNTER — Ambulatory Visit: Payer: Medicare PPO | Admitting: Podiatry

## 2019-11-01 ENCOUNTER — Encounter: Payer: Self-pay | Admitting: Podiatry

## 2019-11-01 ENCOUNTER — Other Ambulatory Visit: Payer: Self-pay

## 2019-11-01 ENCOUNTER — Ambulatory Visit (INDEPENDENT_AMBULATORY_CARE_PROVIDER_SITE_OTHER): Payer: Medicare PPO

## 2019-11-01 VITALS — BP 158/66 | HR 65 | Temp 97.3°F

## 2019-11-01 DIAGNOSIS — M069 Rheumatoid arthritis, unspecified: Secondary | ICD-10-CM

## 2019-11-01 DIAGNOSIS — M7751 Other enthesopathy of right foot: Secondary | ICD-10-CM | POA: Diagnosis not present

## 2019-11-01 DIAGNOSIS — M7750 Other enthesopathy of unspecified foot: Secondary | ICD-10-CM

## 2019-11-01 DIAGNOSIS — M7741 Metatarsalgia, right foot: Secondary | ICD-10-CM

## 2019-11-01 NOTE — Progress Notes (Signed)
Subjective:  Patient ID: Kirsten Horton, female    DOB: [birth year],  MRN: [id number]  Chief Complaint  Patient presents with  . Foot Pain    right foot pain x 6 to 8 months    79 y.o. female presents with the above complaint.  Patient presents with complaints of right great toe pain as well as underlying pain across entire ball of the foot.  Patient states the pain is been going on since last summer when she started walking for exercise.  The pain is across the top of the foot and down the lateral edge.  Patient states that she has known bunions and she saw a previous orthopedic doctor that told her that she has arthritis across the foot.  She denies any other acute complaints.  She would like to know if there is anything that could be done for this.  She would like to know if there is any offloading pads.   Review of Systems: Negative except as noted in the HPI. Denies N/V/F/Ch.  Past Medical History:  Diagnosis Date  . Arthritis   . Asthma    seasonal    Current Outpatient Medications:  .  acetaminophen (TYLENOL) 500 MG tablet, Take 1,000 mg by mouth daily as needed for moderate pain., Disp: , Rfl:  .  albuterol (PROVENTIL HFA;VENTOLIN HFA) 108 (90 BASE) MCG/ACT inhaler, Inhale 2 puffs into the lungs every 6 (six) hours as needed. For shortness of breath, Disp: , Rfl:  .  CALCIUM PO, Take by mouth., Disp: , Rfl:  .  Cyanocobalamin (VITAMIN B 12 PO), Take 1 tablet by mouth daily., Disp: , Rfl:  .  diclofenac sodium (VOLTAREN) 1 % GEL, Apply 2 g topically 4 (four) times daily as needed. For knee pain, Disp: , Rfl:  .  Fluticasone-Salmeterol (ADVAIR) 250-50 MCG/DOSE AEPB, Inhale 1 puff into the lungs 2 (two) times daily., Disp: , Rfl:  .  latanoprost (XALATAN) 0.005 % ophthalmic solution, Place 1 drop into both eyes at bedtime., Disp: , Rfl: 11 .  LIPITOR 80 MG tablet, , Disp: , Rfl:  .  meloxicam (MOBIC) 15 MG tablet, Take 15 mg by mouth daily., Disp: , Rfl:  .  traMADol  (ULTRAM) 50 MG tablet, Take 1 tablet (50 mg total) by mouth every 6 (six) hours as needed. (Patient not taking: Reported on 10/16/2018), Disp: 15 tablet, Rfl: 0 .  VITAMIN D PO, Take by mouth., Disp: , Rfl:   Social History   Tobacco Use  Smoking Status Never Smoker  Smokeless Tobacco Never Used    Allergies  Allergen Reactions  . Penicillins Hives and Rash    Did it involve swelling of the face/tongue/throat, SOB, or low BP? Yes Did it involve sudden or severe rash/hives, skin peeling, or any reaction on the inside of your mouth or nose? No  Did you need to seek medical attention at a hospital or doctor's office? Yes When did it last happen?more than 10 years If all above answers are "NO", may proceed with cephalosporin use.    Objective:   Vitals:   11/01/19 0851  BP: (!) 158/66  Pulse: 65  Temp: (!) 97.3 F (36.3 C)   There is no height or weight on file to calculate BMI. Constitutional Well developed. Well nourished.  Vascular Dorsalis pedis pulses palpable bilaterally. Posterior tibial pulses palpable bilaterally. Capillary refill normal to all digits.  No cyanosis or clubbing noted. Pedal hair growth normal.  Neurologic Normal speech. Oriented to person,  place, and time. Epicritic sensation to light touch grossly present bilaterally.  Dermatologic Nails well groomed and normal in appearance. No open wounds. No skin lesions.  Orthopedic:  Pain on palpation to metatarsal heads 2 through 5.  Pain with range of motion of 6 the metatarsophalangeal joint with digits on the MPJ 2 through 5 active and passively.  Pain on palpation to the medial eminence of the bunion deformity to the right foot.  The bunion appears to be moderate to severe in nature.  It is track bound not tracking deformity.Intra-articular pain noted with range of motion of the first metatarsophalangeal joint with crepitus.  No hypermobility noted of the first metatarsophalangeal joint.  Hammertoe  contractures of digits 2 through 5 noted with adductovarus of the fifth digit.  They are semiflexible in nature.   Radiographs: 3 views of skeletally mature adult foot right: Severe bunion deformity noted at the first metatarsophalangeal joint with intra-articular arthritic changes noted.  The sesamoid position is 7 out of 7.  There is an increasing intermetatarsal angle as well as increasing hallux valgus angle.  There is decreasing calcaneal inclination angle increase in talar declination angle.  There is hammertoe contracture of digits through the 5 noted.  No joints osteophytes or loosening noted.  No metatarsal elevatus noted. Assessment:   1. Metatarsalgia of right foot   2. Rheumatoid arthritis of right foot, unspecified whether rheumatoid factor present [facility])    Plan:  Patient was evaluated and treated and all questions answered.  Right foot metatarsalgia with underlying rheumatoid foot secondary to rheumatoid arthritis controlled -I explained to the patient the etiology of metatarsalgia with underlying rheumatoid foot with increase in bunion deformity that is giving her excessive pressure to the left submetatarsal heads when ambulating.  I discussed with her various treatment options including briefly discussing surgical intervention to correct the foot.  However at this time we will plan on doing metatarsal pads to offload the metatarsal heads and see if there is any decreasing pain associated with this. -If there is a decrease in pain we will discuss orthotics management for long-term.  However patient will benefit from surgical intervention if there is no resolve meant in pain.  No follow-ups on file.

## 2019-11-18 DIAGNOSIS — H35363 Drusen (degenerative) of macula, bilateral: Secondary | ICD-10-CM | POA: Diagnosis not present

## 2019-11-18 DIAGNOSIS — H3509 Other intraretinal microvascular abnormalities: Secondary | ICD-10-CM | POA: Diagnosis not present

## 2019-11-18 DIAGNOSIS — H31091 Other chorioretinal scars, right eye: Secondary | ICD-10-CM | POA: Diagnosis not present

## 2019-11-18 DIAGNOSIS — H3562 Retinal hemorrhage, left eye: Secondary | ICD-10-CM | POA: Diagnosis not present

## 2019-11-18 DIAGNOSIS — H35033 Hypertensive retinopathy, bilateral: Secondary | ICD-10-CM | POA: Diagnosis not present

## 2019-11-29 ENCOUNTER — Other Ambulatory Visit: Payer: Self-pay

## 2019-11-29 ENCOUNTER — Ambulatory Visit: Payer: Medicare PPO | Admitting: Podiatry

## 2019-11-29 DIAGNOSIS — M21611 Bunion of right foot: Secondary | ICD-10-CM | POA: Diagnosis not present

## 2019-11-29 DIAGNOSIS — M7741 Metatarsalgia, right foot: Secondary | ICD-10-CM | POA: Diagnosis not present

## 2019-11-29 DIAGNOSIS — M069 Rheumatoid arthritis, unspecified: Secondary | ICD-10-CM

## 2019-12-03 ENCOUNTER — Encounter: Payer: Self-pay | Admitting: Podiatry

## 2019-12-03 NOTE — Progress Notes (Signed)
Subjective:  Patient ID: Kirsten Horton, female    DOB: 1940-09-08,  MRN: [id number]  Chief Complaint  Patient presents with  . Foot Pain    pt is here for right foot pain of the right foot.    79 y.o. female presents with the above complaint.  Patient presents with a follow-up of pain to the right great toe as well as underlying ball of the foot.  Patient states overall she is doing a little bit better.  Her pain scale still 8 out of 10.  Patient is status subsided a little bit.  I gave her metatarsal pads last time which helped her a lot.  She would like to discuss further conservative therapy and long-term management.  She denies any other acute complaints.   Review of Systems: Negative except as noted in the HPI. Denies N/V/F/Ch.  Past Medical History:  Diagnosis Date  . Arthritis   . Asthma    seasonal    Current Outpatient Medications:  .  acetaminophen (TYLENOL) 500 MG tablet, Take 1,000 mg by mouth daily as needed for moderate pain., Disp: , Rfl:  .  albuterol (PROVENTIL HFA;VENTOLIN HFA) 108 (90 BASE) MCG/ACT inhaler, Inhale 2 puffs into the lungs every 6 (six) hours as needed. For shortness of breath, Disp: , Rfl:  .  CALCIUM PO, Take by mouth., Disp: , Rfl:  .  Cyanocobalamin (VITAMIN B 12 PO), Take 1 tablet by mouth daily., Disp: , Rfl:  .  diclofenac sodium (VOLTAREN) 1 % GEL, Apply 2 g topically 4 (four) times daily as needed. For knee pain, Disp: , Rfl:  .  Fluticasone-Salmeterol (ADVAIR) 250-50 MCG/DOSE AEPB, Inhale 1 puff into the lungs 2 (two) times daily., Disp: , Rfl:  .  latanoprost (XALATAN) 0.005 % ophthalmic solution, Place 1 drop into both eyes at bedtime., Disp: , Rfl: 11 .  LIPITOR 80 MG tablet, , Disp: , Rfl:  .  meloxicam (MOBIC) 15 MG tablet, Take 15 mg by mouth daily., Disp: , Rfl:  .  traMADol (ULTRAM) 50 MG tablet, Take 1 tablet (50 mg total) by mouth every 6 (six) hours as needed., Disp: 15 tablet, Rfl: 0 .  VITAMIN D PO, Take by mouth.,  Disp: , Rfl:   Social History   Tobacco Use  Smoking Status Never Smoker  Smokeless Tobacco Never Used    Allergies  Allergen Reactions  . Penicillins Hives and Rash    Did it involve swelling of the face/tongue/throat, SOB, or low BP? Yes Did it involve sudden or severe rash/hives, skin peeling, or any reaction on the inside of your mouth or nose? No  Did you need to seek medical attention at a hospital or doctor's office? Yes When did it last happen?more than 10 years If all above answers are "NO", may proceed with cephalosporin use.    Objective:   There were no vitals filed for this visit. There is no height or weight on file to calculate BMI. Constitutional Well developed. Well nourished.  Vascular Dorsalis pedis pulses palpable bilaterally. Posterior tibial pulses palpable bilaterally. Capillary refill normal to all digits.  No cyanosis or clubbing noted. Pedal hair growth normal.  Neurologic Normal speech. Oriented to person, place, and time. Epicritic sensation to light touch grossly present bilaterally.  Dermatologic Nails well groomed and normal in appearance. No open wounds. No skin lesions.  Orthopedic:  Pain on palpation to metatarsal heads 2 through 5.  Pain with range of motion of 6 the metatarsophalangeal joint  with digits on the MPJ 2 through 5 active and passively.  Pain on palpation to the medial eminence of the bunion deformity to the right foot.  The bunion appears to be moderate to severe in nature.  It is track bound not tracking deformity.Intra-articular pain noted with range of motion of the first metatarsophalangeal joint with crepitus.  No hypermobility noted of the first metatarsophalangeal joint.  Hammertoe contractures of digits 2 through 5 noted with adductovarus of the fifth digit.  They are semiflexible in nature.   Radiographs: 3 views of skeletally mature adult foot right: Severe bunion deformity noted at the first metatarsophalangeal  joint with intra-articular arthritic changes noted.  The sesamoid position is 7 out of 7.  There is an increasing intermetatarsal angle as well as increasing hallux valgus angle.  There is decreasing calcaneal inclination angle increase in talar declination angle.  There is hammertoe contracture of digits through the 5 noted.  No joints osteophytes or loosening noted.  No metatarsal elevatus noted. Assessment:   1. Metatarsalgia of right foot   2. Rheumatoid arthritis of right foot, unspecified whether rheumatoid factor present (HCC)   3. Bunion, right foot    Plan:  Patient was evaluated and treated and all questions answered.  Right foot metatarsalgia with underlying rheumatoid foot secondary to rheumatoid arthritis controlled -Metatarsal pads have greatly improved her pain however it is not a long-term management.  I discussed with the patient orthotics management as low as shoe gear modifications.  I believe patient will benefit from custom-made orthotics with wider shoe gear modification to allow for proper space within the shoes to take the pressure off of the deformities. -Patient will be scheduled to see Rick for custom-made orthotics with wide toe box shoes to allow for space and alleviation of pressure. -If no resolve meant with orthotics and shoe gear modification at that point will need to discuss surgical intervention.  Return for [employer] with Raiford Noble for [employer].

## 2019-12-10 ENCOUNTER — Other Ambulatory Visit: Payer: Self-pay

## 2019-12-10 ENCOUNTER — Ambulatory Visit (INDEPENDENT_AMBULATORY_CARE_PROVIDER_SITE_OTHER): Payer: Medicare PPO | Admitting: Orthotics

## 2019-12-10 DIAGNOSIS — M21611 Bunion of right foot: Secondary | ICD-10-CM

## 2019-12-10 DIAGNOSIS — M069 Rheumatoid arthritis, unspecified: Secondary | ICD-10-CM

## 2019-12-10 DIAGNOSIS — M7741 Metatarsalgia, right foot: Secondary | ICD-10-CM

## 2019-12-10 NOTE — Progress Notes (Signed)
Patient was cast today for CMFO; she has hx of RA/bunion, HT contractures, dimiishing fat pad/metatarsalgis.  PLAN on super soft device dancers padding (1st met offload), met pads  Financial responsibility discussed and ABN signed.

## 2019-12-17 DIAGNOSIS — H401134 Primary open-angle glaucoma, bilateral, indeterminate stage: Secondary | ICD-10-CM | POA: Diagnosis not present

## 2019-12-17 DIAGNOSIS — H524 Presbyopia: Secondary | ICD-10-CM | POA: Diagnosis not present

## 2019-12-17 DIAGNOSIS — H26493 Other secondary cataract, bilateral: Secondary | ICD-10-CM | POA: Diagnosis not present

## 2019-12-17 DIAGNOSIS — Z961 Presence of intraocular lens: Secondary | ICD-10-CM | POA: Diagnosis not present

## 2019-12-19 DIAGNOSIS — H35372 Puckering of macula, left eye: Secondary | ICD-10-CM | POA: Diagnosis not present

## 2019-12-19 DIAGNOSIS — H3509 Other intraretinal microvascular abnormalities: Secondary | ICD-10-CM | POA: Diagnosis not present

## 2019-12-19 DIAGNOSIS — H3562 Retinal hemorrhage, left eye: Secondary | ICD-10-CM | POA: Diagnosis not present

## 2020-01-07 ENCOUNTER — Other Ambulatory Visit: Payer: Medicare PPO | Admitting: Orthotics

## 2020-01-08 ENCOUNTER — Telehealth: Payer: Self-pay | Admitting: Podiatry

## 2020-01-08 NOTE — Telephone Encounter (Signed)
Pt left message yesterday @ 933am stating she is not able to keep appt 6.1.2021.   I returned call and voicemail is full on home and cell rings busy.

## 2020-01-20 ENCOUNTER — Ambulatory Visit: Payer: Medicare PPO | Admitting: Orthotics

## 2020-01-20 ENCOUNTER — Other Ambulatory Visit: Payer: Self-pay

## 2020-01-20 DIAGNOSIS — M21611 Bunion of right foot: Secondary | ICD-10-CM

## 2020-01-20 DIAGNOSIS — M7741 Metatarsalgia, right foot: Secondary | ICD-10-CM

## 2020-01-20 DIAGNOSIS — M069 Rheumatoid arthritis, unspecified: Secondary | ICD-10-CM

## 2020-01-20 NOTE — Progress Notes (Signed)
Patient came in today to pick up custom made foot orthotics.  The goals were accomplished and the patient reported no dissatisfaction with said orthotics.  Patient was advised of breakin period and how to report any issues. 

## 2020-01-24 ENCOUNTER — Encounter: Payer: Self-pay | Admitting: Podiatry

## 2020-01-24 ENCOUNTER — Ambulatory Visit (INDEPENDENT_AMBULATORY_CARE_PROVIDER_SITE_OTHER): Payer: Medicare PPO

## 2020-01-24 ENCOUNTER — Ambulatory Visit: Payer: Medicare PPO | Admitting: Podiatry

## 2020-01-24 ENCOUNTER — Other Ambulatory Visit: Payer: Self-pay

## 2020-01-24 DIAGNOSIS — M21611 Bunion of right foot: Secondary | ICD-10-CM | POA: Diagnosis not present

## 2020-01-24 DIAGNOSIS — M2041 Other hammer toe(s) (acquired), right foot: Secondary | ICD-10-CM

## 2020-01-24 DIAGNOSIS — Z01818 Encounter for other preprocedural examination: Secondary | ICD-10-CM

## 2020-01-24 DIAGNOSIS — M7741 Metatarsalgia, right foot: Secondary | ICD-10-CM

## 2020-01-24 NOTE — Patient Instructions (Signed)
Pre-Operative Instructions  Congratulations, you have decided to take an important step towards improving your quality of life.  You can be assured that the doctors and staff at [facility] will be with you every step of the way.  Here are some important things you should know:  1. Plan to be at the surgery center/hospital at least 1 (one) hour prior to your scheduled time, unless otherwise directed by the surgical center/hospital staff.  You must have a responsible adult accompany you, remain during the surgery and drive you home.  Make sure you have directions to the surgical center/hospital to ensure you arrive on time. 2. If you are having surgery at [facility] or [facility] hospitals, you will need a copy of your medical history and physical form from your family physician within one month prior to the date of surgery. We will give you a form for your primary physician to complete.  3. We make every effort to accommodate the date you request for surgery.  However, there are times where surgery dates or times have to be moved.  We will contact you as soon as possible if a change in schedule is required.   4. No aspirin/ibuprofen for one week before surgery.  If you are on aspirin, any non-steroidal anti-inflammatory medications (Mobic, Aleve, Ibuprofen) should not be taken seven (7) days prior to your surgery.  You make take Tylenol for pain prior to surgery.  5. Medications - If you are taking daily heart and blood pressure medications, seizure, reflux, allergy, asthma, anxiety, pain or diabetes medications, make sure you notify the surgery center/hospital before the day of surgery so they can tell you which medications you should take or avoid the day of surgery. 6. No food or drink after midnight the night before surgery unless directed otherwise by surgical center/hospital staff. 7. No alcoholic beverages 24-hours prior to surgery.  No smoking 24-hours prior or 24-hours after  surgery. 8. Wear loose pants or shorts. They should be loose enough to fit over bandages, boots, and casts. 9. Don't wear slip-on shoes. Sneakers are preferred. 10. Bring your boot with you to the surgery center/hospital.  Also bring crutches or a walker if your physician has prescribed it for you.  If you do not have this equipment, it will be provided for you after surgery. 11. If you have not been contacted by the surgery center/hospital by the day before your surgery, call to confirm the date and time of your surgery. 12. Leave-time from work may vary depending on the type of surgery you have.  Appropriate arrangements should be made prior to surgery with your employer. 13. Prescriptions will be provided immediately following surgery by your doctor.  Fill these as soon as possible after surgery and take the medication as directed. Pain medications will not be refilled on weekends and must be approved by the doctor. 14. Remove nail polish on the operative foot and avoid getting pedicures prior to surgery. 15. Wash the night before surgery.  The night before surgery wash the foot and leg well with water and the antibacterial soap provided. Be sure to pay special attention to beneath the toenails and in between the toes.  Wash for at least three (3) minutes. Rinse thoroughly with water and dry well with a towel.  Perform this wash unless told not to do so by your physician.  Enclosed: 1 Ice pack (please put in freezer the night before surgery)   1 Hibiclens skin cleaner     Pre-op instructions  If you have any questions regarding the instructions, please do not hesitate to call our office.  [place]: [address], [place], [place] [address] -- [phone]  [place]: [address] [place], [place] [address] -- [phone]  [place]: [address], [place], [place] [address] -- [phone]   Website: [email or web address] 

## 2020-01-28 ENCOUNTER — Encounter: Payer: Self-pay | Admitting: Podiatry

## 2020-01-28 NOTE — Progress Notes (Signed)
Subjective:  Patient ID: Kirsten Horton, female    DOB: Oct 06, 1940,  MRN: [id number]  Chief Complaint  Patient presents with  . Foot Problem    i am doing pretty good on the right foot and the inserts are doing good     79 y.o. female presents with the above complaint.  Patient presents with right foot bunion deformity as well as right second digit hammertoe.  Patient states that she had gone the orthotics which helped a lot.  However she still continues to have a lot of pain.  She would like to discuss surgical options at this time.  She states that it hurts when ambulating.  She has not gotten any relief as much as she would like.  She does have good relief from orthotics.  She would like to discuss surgery as she has failed all conservative therapy including padding and protection.  Review of Systems: Negative except as noted in the HPI. Denies N/V/F/Ch.  Past Medical History:  Diagnosis Date  . Arthritis   . Asthma    seasonal    Current Outpatient Medications:  .  acetaminophen (TYLENOL) 500 MG tablet, Take 1,000 mg by mouth daily as needed for moderate pain., Disp: , Rfl:  .  albuterol (PROVENTIL HFA;VENTOLIN HFA) 108 (90 BASE) MCG/ACT inhaler, Inhale 2 puffs into the lungs every 6 (six) hours as needed. For shortness of breath, Disp: , Rfl:  .  CALCIUM PO, Take by mouth., Disp: , Rfl:  .  Cyanocobalamin (VITAMIN B 12 PO), Take 1 tablet by mouth daily., Disp: , Rfl:  .  diclofenac sodium (VOLTAREN) 1 % GEL, Apply 2 g topically 4 (four) times daily as needed. For knee pain, Disp: , Rfl:  .  Fluticasone-Salmeterol (ADVAIR) 250-50 MCG/DOSE AEPB, Inhale 1 puff into the lungs 2 (two) times daily., Disp: , Rfl:  .  latanoprost (XALATAN) 0.005 % ophthalmic solution, Place 1 drop into both eyes at bedtime., Disp: , Rfl: 11 .  LIPITOR 80 MG tablet, , Disp: , Rfl:  .  meloxicam (MOBIC) 15 MG tablet, Take 15 mg by mouth daily., Disp: , Rfl:  .  traMADol (ULTRAM) 50 MG tablet,  Take 1 tablet (50 mg total) by mouth every 6 (six) hours as needed., Disp: 15 tablet, Rfl: 0 .  VITAMIN D PO, Take by mouth., Disp: , Rfl:   Social History   Tobacco Use  Smoking Status Never Smoker  Smokeless Tobacco Never Used    Allergies  Allergen Reactions  . Penicillins Hives and Rash    Did it involve swelling of the face/tongue/throat, SOB, or low BP? Yes Did it involve sudden or severe rash/hives, skin peeling, or any reaction on the inside of your mouth or nose? No  Did you need to seek medical attention at a hospital or doctor's office? Yes When did it last happen?more than 10 years If all above answers are "NO", may proceed with cephalosporin use.    Objective:  There were no vitals filed for this visit. There is no height or weight on file to calculate BMI. Constitutional Well developed. Well nourished.  Vascular Dorsalis pedis pulses palpable bilaterally. Posterior tibial pulses palpable bilaterally. Capillary refill normal to all digits.  No cyanosis or clubbing noted. Pedal hair growth normal.  Neurologic Normal speech. Oriented to person, place, and time. Epicritic sensation to light touch grossly present bilaterally.  Dermatologic Nails well groomed and normal in appearance. No open wounds. No skin lesions.  Orthopedic: Normal joint  ROM without pain or crepitus bilaterally. Hallux abductovalgus deformity Right foot.  Mild intra-articular pain noted. This is a track bound deformity not tracking. Left 1st MPJ diminished range of motion.   Left 1st TMT without gross hypermobility. Right 1st MPJ diminished range of motion  Right 1st TMT without gross hypermobility. Lesser digital contractures present right.  Second digit hammertoe contracture semiflexible in nature   Radiographs: Taken and reviewed. Hallux abductovalgus deformity present. Metatarsal parabola within normal limits.  Severe bunion deformity noted.  Arthritic changes noted in the first  metatarsophalangeal joint.. 1st/2nd IMA: Severe; TSP: 6 out of 7.    Assessment:   1. Hammertoe of second toe of right foot   2. Bunion, right foot    Plan:  Patient was evaluated and treated and all questions answered.  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 -Patient has failed all conservative therapy and wishes to proceed with surgical intervention. All risks, benefits, and alternatives discussed with patient. No guarantees given. Consent reviewed and signed by patient. Post-op course explained at length. -Planned procedures: Right chevron osteotomy with Akin osteotomy and repair of right second hammertoe contracture with a possible MPJ capsulotomy of the second. -Risk factors: Rheumatoid arthritis however it is well controlled. -I discussed with the patient my surgical planning as well as various treatment options were extensively discussed.  Patient does not want any sort of a fusion to the right side.  Patient would like to discuss surgical options including fixing the bunion deformity and the bump.  I did discuss with the patient that she will ultimately benefit from fusion given the severe nature of the IM angle as well as concern for intra-articular pain with associated arthritic changes on x-ray.  However patient was adamant about a bunion procedure likely a had procedure that can give her enough relief to take the pressure off of the bump.  Patient is adamant about no fusion that includes MPJ as well as first tarsometatarsal phalangeal joint fusion including Lapidus.  I did discuss with the patient that she will benefit ultimately from fusion however we will plan on proceeding with the bunion surgery if there is an adequate relief or if I am not able to get correction we may have to discuss revising it to a fusion and near future.  Patient states understanding. -I discussed with the  patient my postop protocol in extensive detail.  Patient will be weightbearing as tolerated with a cam boot. -Cam boot was dispensed -Informed surgical risk consent was reviewed and read aloud to the patient.  I reviewed the films.  I have discussed my findings with the patient in great detail.  I have discussed all risks including but not limited to infection, stiffness, scarring, limp, disability, deformity, damage to blood vessels and nerves, numbness, poor healing, need for braces, arthritis, chronic pain, amputation, death.  All benefits and realistic expectations discussed in great detail.  I have made no promises as to the outcome.  I have provided realistic expectations.  I have offered the patient a 2nd opinion, which they have declined and assured me they preferred to proceed despite the risks -A total of 37 minutes was spent in direct patient care as well as pre and post patient encounter activities.  This includes documentation as well as reviewing patient chart for labs, imaging, past medical, surgical, social, and family history as documented in the EMR.  I have reviewed medication allergies as  documented in EMR.  I discussed the etiology of condition and treatment options from conservative to surgical care.  All risks and benefit of the treatment course was discussed in detail.  All questions were answered and return appointment was discussed.  Since the visit completed in an ambulatory/outpatient setting, the patient and/or parent/guardian has been advised to contact the providers office for worsening condition and seek medical treatment and/or call 911 if the patient deems either is necessary.   No follow-ups on file.

## 2020-02-03 ENCOUNTER — Telehealth: Payer: Self-pay

## 2020-02-03 NOTE — Telephone Encounter (Signed)
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

## 2020-02-24 ENCOUNTER — Encounter: Payer: Self-pay | Admitting: Podiatry

## 2020-02-24 DIAGNOSIS — M2041 Other hammer toe(s) (acquired), right foot: Secondary | ICD-10-CM | POA: Diagnosis not present

## 2020-02-24 DIAGNOSIS — J45909 Unspecified asthma, uncomplicated: Secondary | ICD-10-CM | POA: Diagnosis not present

## 2020-02-24 DIAGNOSIS — M25571 Pain in right ankle and joints of right foot: Secondary | ICD-10-CM | POA: Diagnosis not present

## 2020-02-24 DIAGNOSIS — M2042 Other hammer toe(s) (acquired), left foot: Secondary | ICD-10-CM | POA: Diagnosis not present

## 2020-02-24 DIAGNOSIS — M2011 Hallux valgus (acquired), right foot: Secondary | ICD-10-CM | POA: Diagnosis not present

## 2020-02-24 DIAGNOSIS — M21611 Bunion of right foot: Secondary | ICD-10-CM | POA: Diagnosis not present

## 2020-02-24 MED ORDER — IBUPROFEN 800 MG PO TABS: 800.0000 mg | ORAL_TABLET | Freq: Four times a day (QID) | ORAL | 1 refills | Status: DC | PRN

## 2020-02-24 MED ORDER — OXYCODONE-ACETAMINOPHEN 10-325 MG PO TABS: 1.0000 | ORAL_TABLET | Freq: Four times a day (QID) | ORAL | 0 refills | Status: AC | PRN

## 2020-02-24 NOTE — Addendum Note (Signed)
Addended by: Nicholes Rough on: 02/24/2020 07:10 AM   Modules accepted: Orders

## 2020-03-04 ENCOUNTER — Ambulatory Visit (INDEPENDENT_AMBULATORY_CARE_PROVIDER_SITE_OTHER): Payer: Medicare PPO

## 2020-03-04 ENCOUNTER — Ambulatory Visit (INDEPENDENT_AMBULATORY_CARE_PROVIDER_SITE_OTHER): Payer: Medicare PPO | Admitting: Podiatry

## 2020-03-04 ENCOUNTER — Other Ambulatory Visit: Payer: Self-pay

## 2020-03-04 DIAGNOSIS — M2041 Other hammer toe(s) (acquired), right foot: Secondary | ICD-10-CM | POA: Diagnosis not present

## 2020-03-04 DIAGNOSIS — Z9889 Other specified postprocedural states: Secondary | ICD-10-CM

## 2020-03-04 DIAGNOSIS — M21611 Bunion of right foot: Secondary | ICD-10-CM

## 2020-03-04 NOTE — Progress Notes (Signed)
Dos 02/24/20 Right [place] Bunionectomy with correction Bunion by phalanx osteotomy 

## 2020-03-05 ENCOUNTER — Encounter: Payer: Self-pay | Admitting: Podiatry

## 2020-03-05 NOTE — Progress Notes (Signed)
Subjective:  Patient ID: Kirsten Horton, female    DOB: 02-09-1941,  MRN: [id number]  Chief Complaint  Patient presents with  . Routine Post Op    POV #1 DOS 02/24/20 RT CORRECTION BUNIONECTOMY W/PHALANX OSTEOTOMY W/REPAIR OF 2ND HAMMERTOE W/MPJ CAPSULOTOMY      79 y.o. female returns for post-op check.  Patient is doing well.  She denies any other acute complaints.  She is taking her pain meds.  Keeping about dry clean and Band-Aid.  She has not been walking too much on it.  She has been wearing her boot.  Review of Systems: Negative except as noted in the HPI. Denies N/V/F/Ch.  Past Medical History:  Diagnosis Date  . Arthritis   . Asthma    seasonal    Current Outpatient Medications:  .  acetaminophen (TYLENOL) 500 MG tablet, Take 1,000 mg by mouth daily as needed for moderate pain., Disp: , Rfl:  .  albuterol (PROVENTIL HFA;VENTOLIN HFA) 108 (90 BASE) MCG/ACT inhaler, Inhale 2 puffs into the lungs every 6 (six) hours as needed. For shortness of breath, Disp: , Rfl:  .  CALCIUM PO, Take by mouth., Disp: , Rfl:  .  Cyanocobalamin (VITAMIN B 12 PO), Take 1 tablet by mouth daily., Disp: , Rfl:  .  diclofenac sodium (VOLTAREN) 1 % GEL, Apply 2 g topically 4 (four) times daily as needed. For knee pain, Disp: , Rfl:  .  Fluticasone-Salmeterol (ADVAIR) 250-50 MCG/DOSE AEPB, Inhale 1 puff into the lungs 2 (two) times daily., Disp: , Rfl:  .  ibuprofen (ADVIL) 800 MG tablet, Take 1 tablet (800 mg total) by mouth every 6 (six) hours as needed., Disp: 60 tablet, Rfl: 1 .  latanoprost (XALATAN) 0.005 % ophthalmic solution, Place 1 drop into both eyes at bedtime., Disp: , Rfl: 11 .  LIPITOR 80 MG tablet, , Disp: , Rfl:  .  meloxicam (MOBIC) 15 MG tablet, Take 15 mg by mouth daily., Disp: , Rfl:  .  traMADol (ULTRAM) 50 MG tablet, Take 1 tablet (50 mg total) by mouth every 6 (six) hours as needed., Disp: 15 tablet, Rfl: 0 .  VITAMIN D PO, Take by mouth., Disp: , Rfl:   Social  History   Tobacco Use  Smoking Status Never Smoker  Smokeless Tobacco Never Used    Allergies  Allergen Reactions  . Penicillins Hives and Rash    Did it involve swelling of the face/tongue/throat, SOB, or low BP? Yes Did it involve sudden or severe rash/hives, skin peeling, or any reaction on the inside of your mouth or nose? No  Did you need to seek medical attention at a hospital or doctor's office? Yes When did it last happen?more than 10 years If all above answers are "NO", may proceed with cephalosporin use.    Objective:  There were no vitals filed for this visit. There is no height or weight on file to calculate BMI. Constitutional Well developed. Well nourished.  Vascular Foot warm and well perfused. Capillary refill normal to all digits.   Neurologic Normal speech. Oriented to person, place, and time. Epicritic sensation to light touch grossly present bilaterally.  Dermatologic Skin healing well without signs of infection. Skin edges well coapted without signs of infection.  Orthopedic: Tenderness to palpation noted about the surgical site.   Radiographs: 3 views of skeletally mature the right foot: Good correction alignment noted.  No signs of loosening or breakage of the hardware noted.  Pins intact. Assessment:   1.  Hammertoe of second toe of right foot   2. Bunion, right foot   3. Status post foot surgery    Plan:  Patient was evaluated and treated and all questions answered.  S/p foot surgery right -Progressing as expected post-operatively. -XR: See above -WB Status: Weightbearing as tolerated in cam boot -Sutures: Intact.  No signs of dehiscence noted.  No signs of infection noted. -Medications: None -Foot redressed.  No follow-ups on file.

## 2020-03-09 DIAGNOSIS — M859 Disorder of bone density and structure, unspecified: Secondary | ICD-10-CM | POA: Diagnosis not present

## 2020-03-09 DIAGNOSIS — I10 Essential (primary) hypertension: Secondary | ICD-10-CM | POA: Diagnosis not present

## 2020-03-09 DIAGNOSIS — R7302 Impaired glucose tolerance (oral): Secondary | ICD-10-CM | POA: Diagnosis not present

## 2020-03-09 DIAGNOSIS — Z0000 Encounter for general adult medical examination without abnormal findings: Secondary | ICD-10-CM | POA: Diagnosis not present

## 2020-03-09 DIAGNOSIS — E7800 Pure hypercholesterolemia, unspecified: Secondary | ICD-10-CM | POA: Diagnosis not present

## 2020-03-11 ENCOUNTER — Ambulatory Visit (INDEPENDENT_AMBULATORY_CARE_PROVIDER_SITE_OTHER): Payer: Medicare PPO | Admitting: Podiatry

## 2020-03-11 ENCOUNTER — Other Ambulatory Visit: Payer: Self-pay

## 2020-03-11 DIAGNOSIS — M2041 Other hammer toe(s) (acquired), right foot: Secondary | ICD-10-CM

## 2020-03-11 DIAGNOSIS — M21611 Bunion of right foot: Secondary | ICD-10-CM

## 2020-03-11 DIAGNOSIS — Z9889 Other specified postprocedural states: Secondary | ICD-10-CM

## 2020-03-13 ENCOUNTER — Encounter: Payer: Self-pay | Admitting: Podiatry

## 2020-03-13 NOTE — Progress Notes (Signed)
Subjective:  Patient ID: Kirsten Horton, female    DOB: 11-May-1941,  MRN: [id number]  Chief Complaint  Patient presents with   Routine Post Op    POV #2 DOS 02/24/20 RT CORRECTION BUNIONECTOMY W/PHALANX OSTEOTOMY W/REPAIR OF 2ND HAMMERTOE W/MPJ CAPSULOTOMY      79 y.o. female returns for post-op check.  Patient is doing well.  She denies any other acute complaints.  She is taking her pain meds.  Keeping about dry clean and Band-Aid.  She has not been walking too much on it.  She has been wearing her boot.  Review of Systems: Negative except as noted in the HPI. Denies N/V/F/Ch.  Past Medical History:  Diagnosis Date   Arthritis    Asthma    seasonal    Current Outpatient Medications:    acetaminophen (TYLENOL) 500 MG tablet, Take 1,000 mg by mouth daily as needed for moderate pain., Disp: , Rfl:    albuterol (PROVENTIL HFA;VENTOLIN HFA) 108 (90 BASE) MCG/ACT inhaler, Inhale 2 puffs into the lungs every 6 (six) hours as needed. For shortness of breath, Disp: , Rfl:    CALCIUM PO, Take by mouth., Disp: , Rfl:    Cyanocobalamin (VITAMIN B 12 PO), Take 1 tablet by mouth daily., Disp: , Rfl:    diclofenac sodium (VOLTAREN) 1 % GEL, Apply 2 g topically 4 (four) times daily as needed. For knee pain, Disp: , Rfl:    Fluticasone-Salmeterol (ADVAIR) 250-50 MCG/DOSE AEPB, Inhale 1 puff into the lungs 2 (two) times daily., Disp: , Rfl:    ibuprofen (ADVIL) 800 MG tablet, Take 1 tablet (800 mg total) by mouth every 6 (six) hours as needed., Disp: 60 tablet, Rfl: 1   latanoprost (XALATAN) 0.005 % ophthalmic solution, Place 1 drop into both eyes at bedtime., Disp: , Rfl: 11   LIPITOR 80 MG tablet, , Disp: , Rfl:    meloxicam (MOBIC) 15 MG tablet, Take 15 mg by mouth daily., Disp: , Rfl:    traMADol (ULTRAM) 50 MG tablet, Take 1 tablet (50 mg total) by mouth every 6 (six) hours as needed., Disp: 15 tablet, Rfl: 0   VITAMIN D PO, Take by mouth., Disp: , Rfl:   Social  History   Tobacco Use  Smoking Status Never Smoker  Smokeless Tobacco Never Used    Allergies  Allergen Reactions   Penicillins Hives and Rash    Did it involve swelling of the face/tongue/throat, SOB, or low BP? Yes Did it involve sudden or severe rash/hives, skin peeling, or any reaction on the inside of your mouth or nose? No  Did you need to seek medical attention at a hospital or doctor's office? Yes When did it last happen?more than 10 years If all above answers are "NO", may proceed with cephalosporin use.    Objective:  There were no vitals filed for this visit. There is no height or weight on file to calculate BMI. Constitutional Well developed. Well nourished.  Vascular Foot warm and well perfused. Capillary refill normal to all digits.   Neurologic Normal speech. Oriented to person, place, and time. Epicritic sensation to light touch grossly present bilaterally.  Dermatologic Skin healing well without signs of infection. Skin edges well coapted without signs of infection.  Orthopedic: Tenderness to palpation noted about the surgical site.   Radiographs: 3 views of skeletally mature the right foot: Good correction alignment noted.  No signs of loosening or breakage of the hardware noted.  Pins intact. Assessment:   1.  Hammertoe of second toe of right foot   2. Bunion, right foot   3. Status post foot surgery    Plan:  Patient was evaluated and treated and all questions answered.  S/p foot surgery right -Progressing as expected post-operatively. -XR: See above -WB Status: Weightbearing as tolerated in cam boot -Sutures: Removed.  No signs of dehiscence noted no clinical signs of infection noted. -Medications: None -Foot redressed.  No follow-ups on file.

## 2020-03-18 DIAGNOSIS — I10 Essential (primary) hypertension: Secondary | ICD-10-CM | POA: Diagnosis not present

## 2020-03-18 DIAGNOSIS — Z0000 Encounter for general adult medical examination without abnormal findings: Secondary | ICD-10-CM | POA: Diagnosis not present

## 2020-03-18 DIAGNOSIS — J4540 Moderate persistent asthma, uncomplicated: Secondary | ICD-10-CM | POA: Diagnosis not present

## 2020-03-18 DIAGNOSIS — K5790 Diverticulosis of intestine, part unspecified, without perforation or abscess without bleeding: Secondary | ICD-10-CM | POA: Diagnosis not present

## 2020-03-18 DIAGNOSIS — M179 Osteoarthritis of knee, unspecified: Secondary | ICD-10-CM | POA: Diagnosis not present

## 2020-03-18 DIAGNOSIS — R82998 Other abnormal findings in urine: Secondary | ICD-10-CM | POA: Diagnosis not present

## 2020-03-18 DIAGNOSIS — E7800 Pure hypercholesterolemia, unspecified: Secondary | ICD-10-CM | POA: Diagnosis not present

## 2020-03-18 DIAGNOSIS — M8580 Other specified disorders of bone density and structure, unspecified site: Secondary | ICD-10-CM | POA: Diagnosis not present

## 2020-03-18 DIAGNOSIS — Z1212 Encounter for screening for malignant neoplasm of rectum: Secondary | ICD-10-CM | POA: Diagnosis not present

## 2020-03-18 DIAGNOSIS — G63 Polyneuropathy in diseases classified elsewhere: Secondary | ICD-10-CM | POA: Diagnosis not present

## 2020-03-18 DIAGNOSIS — R7302 Impaired glucose tolerance (oral): Secondary | ICD-10-CM | POA: Diagnosis not present

## 2020-03-25 ENCOUNTER — Ambulatory Visit (INDEPENDENT_AMBULATORY_CARE_PROVIDER_SITE_OTHER): Payer: Medicare PPO | Admitting: Podiatry

## 2020-03-25 ENCOUNTER — Other Ambulatory Visit: Payer: Self-pay

## 2020-03-25 ENCOUNTER — Ambulatory Visit (INDEPENDENT_AMBULATORY_CARE_PROVIDER_SITE_OTHER): Payer: Medicare PPO

## 2020-03-25 ENCOUNTER — Other Ambulatory Visit: Payer: Self-pay | Admitting: Podiatry

## 2020-03-25 DIAGNOSIS — M21611 Bunion of right foot: Secondary | ICD-10-CM | POA: Diagnosis not present

## 2020-03-25 DIAGNOSIS — M21619 Bunion of unspecified foot: Secondary | ICD-10-CM

## 2020-03-25 DIAGNOSIS — Z9889 Other specified postprocedural states: Secondary | ICD-10-CM | POA: Diagnosis not present

## 2020-03-25 DIAGNOSIS — M2041 Other hammer toe(s) (acquired), right foot: Secondary | ICD-10-CM

## 2020-03-26 ENCOUNTER — Encounter: Payer: Self-pay | Admitting: Podiatry

## 2020-03-26 NOTE — Progress Notes (Signed)
Subjective:  Patient ID: Kirsten Horton, female    DOB: Sep 09, 1940,  MRN: [id number]  Chief Complaint  Patient presents with  . Routine Post Op    POV#3 Denies fever/chills/nausea/vomiting. Pt states no concerns.      79 y.o. female returns for post-op check.  Patient is doing well.  She denies any other acute complaints.  She is taking her pain meds.  Keeping about dry clean and Band-Aid.  She has not been walking too much on it.  She has been wearing her boot.  Review of Systems: Negative except as noted in the HPI. Denies N/V/F/Ch.  Past Medical History:  Diagnosis Date  . Arthritis   . Asthma    seasonal    Current Outpatient Medications:  .  acetaminophen (TYLENOL) 500 MG tablet, Take 1,000 mg by mouth daily as needed for moderate pain., Disp: , Rfl:  .  albuterol (PROVENTIL HFA;VENTOLIN HFA) 108 (90 BASE) MCG/ACT inhaler, Inhale 2 puffs into the lungs every 6 (six) hours as needed. For shortness of breath, Disp: , Rfl:  .  CALCIUM PO, Take by mouth., Disp: , Rfl:  .  Cyanocobalamin (VITAMIN B 12 PO), Take 1 tablet by mouth daily., Disp: , Rfl:  .  diclofenac sodium (VOLTAREN) 1 % GEL, Apply 2 g topically 4 (four) times daily as needed. For knee pain, Disp: , Rfl:  .  Fluticasone-Salmeterol (ADVAIR) 250-50 MCG/DOSE AEPB, Inhale 1 puff into the lungs 2 (two) times daily., Disp: , Rfl:  .  ibuprofen (ADVIL) 800 MG tablet, Take 1 tablet (800 mg total) by mouth every 6 (six) hours as needed., Disp: 60 tablet, Rfl: 1 .  latanoprost (XALATAN) 0.005 % ophthalmic solution, Place 1 drop into both eyes at bedtime., Disp: , Rfl: 11 .  LIPITOR 80 MG tablet, , Disp: , Rfl:  .  meloxicam (MOBIC) 15 MG tablet, Take 15 mg by mouth daily., Disp: , Rfl:  .  oxybutynin (DITROPAN) 5 MG tablet, , Disp: , Rfl:  .  traMADol (ULTRAM) 50 MG tablet, Take 1 tablet (50 mg total) by mouth every 6 (six) hours as needed., Disp: 15 tablet, Rfl: 0 .  VITAMIN D PO, Take by mouth., Disp: , Rfl:    Social History   Tobacco Use  Smoking Status Never Smoker  Smokeless Tobacco Never Used    Allergies  Allergen Reactions  . Penicillins Hives and Rash    Did it involve swelling of the face/tongue/throat, SOB, or low BP? Yes Did it involve sudden or severe rash/hives, skin peeling, or any reaction on the inside of your mouth or nose? No  Did you need to seek medical attention at a hospital or doctor's office? Yes When did it last happen?more than 10 years If all above answers are "NO", may proceed with cephalosporin use.    Objective:  There were no vitals filed for this visit. There is no height or weight on file to calculate BMI. Constitutional Well developed. Well nourished.  Vascular Foot warm and well perfused. Capillary refill normal to all digits.   Neurologic Normal speech. Oriented to person, place, and time. Epicritic sensation to light touch grossly present bilaterally.  Dermatologic Skin healing well without signs of infection. Skin edges well coapted without signs of infection. Good range of motion noted of the first MPJ.  Orthopedic:  Mild tenderness to palpation noted about the surgical site.   Radiographs: 3 views of skeletally mature the right foot: Good correction alignment noted.  No signs  of loosening or breakage of the hardware noted.  Pins intact. Assessment:   1. Hammertoe of second toe of right foot   2. Bunion, right foot   3. Status post foot surgery    Plan:  Patient was evaluated and treated and all questions answered.  S/p foot surgery right -Progressing as expected post-operatively. -XR: See above -WB Status: She can begin transitioning to surgical shoe weightbearing as tolerated. -Sutures: Removed.  No signs of dehiscence noted no clinical signs of infection noted. -Medications: None -Foot redressed. I will plan on removing the K wire during next clinical visit.  No follow-ups on file.

## 2020-04-08 ENCOUNTER — Ambulatory Visit (INDEPENDENT_AMBULATORY_CARE_PROVIDER_SITE_OTHER): Payer: Medicare PPO | Admitting: Podiatry

## 2020-04-08 ENCOUNTER — Other Ambulatory Visit: Payer: Self-pay

## 2020-04-08 DIAGNOSIS — Z9889 Other specified postprocedural states: Secondary | ICD-10-CM

## 2020-04-08 DIAGNOSIS — M21611 Bunion of right foot: Secondary | ICD-10-CM

## 2020-04-08 DIAGNOSIS — M2041 Other hammer toe(s) (acquired), right foot: Secondary | ICD-10-CM

## 2020-04-10 ENCOUNTER — Encounter: Payer: Self-pay | Admitting: Podiatry

## 2020-04-10 NOTE — Progress Notes (Signed)
Subjective:  Patient ID: Kirsten Horton, female    DOB: 06-Sep-1940,  MRN: [id number]  Chief Complaint  Patient presents with  . Routine Post Op     POV #4 DOS 02/24/20 RT CORRECTION BUNIONECTOMY W/PHALANX OSTEOTOMY W/REPAIR OF 2ND HAMMERTOE W/MPJ CAPSULOTOMY      79 y.o. female returns for post-op check.  Patient is doing well.  She denies any other acute complaints.  She is taking her pain meds.  Keeping about dry clean and Band-Aid.  She has not been walking too much on it.  She has been wearing her boot.  Review of Systems: Negative except as noted in the HPI. Denies N/V/F/Ch.  Past Medical History:  Diagnosis Date  . Arthritis   . Asthma    seasonal    Current Outpatient Medications:  .  acetaminophen (TYLENOL) 500 MG tablet, Take 1,000 mg by mouth daily as needed for moderate pain., Disp: , Rfl:  .  albuterol (PROVENTIL HFA;VENTOLIN HFA) 108 (90 BASE) MCG/ACT inhaler, Inhale 2 puffs into the lungs every 6 (six) hours as needed. For shortness of breath, Disp: , Rfl:  .  CALCIUM PO, Take by mouth., Disp: , Rfl:  .  Cyanocobalamin (VITAMIN B 12 PO), Take 1 tablet by mouth daily., Disp: , Rfl:  .  diclofenac sodium (VOLTAREN) 1 % GEL, Apply 2 g topically 4 (four) times daily as needed. For knee pain, Disp: , Rfl:  .  Fluticasone-Salmeterol (ADVAIR) 250-50 MCG/DOSE AEPB, Inhale 1 puff into the lungs 2 (two) times daily., Disp: , Rfl:  .  ibuprofen (ADVIL) 800 MG tablet, Take 1 tablet (800 mg total) by mouth every 6 (six) hours as needed., Disp: 60 tablet, Rfl: 1 .  latanoprost (XALATAN) 0.005 % ophthalmic solution, Place 1 drop into both eyes at bedtime., Disp: , Rfl: 11 .  LIPITOR 80 MG tablet, , Disp: , Rfl:  .  meloxicam (MOBIC) 15 MG tablet, Take 15 mg by mouth daily., Disp: , Rfl:  .  oxybutynin (DITROPAN) 5 MG tablet, , Disp: , Rfl:  .  traMADol (ULTRAM) 50 MG tablet, Take 1 tablet (50 mg total) by mouth every 6 (six) hours as needed., Disp: 15 tablet, Rfl: 0 .   VITAMIN D PO, Take by mouth., Disp: , Rfl:   Social History   Tobacco Use  Smoking Status Never Smoker  Smokeless Tobacco Never Used    Allergies  Allergen Reactions  . Penicillins Hives and Rash    Did it involve swelling of the face/tongue/throat, SOB, or low BP? Yes Did it involve sudden or severe rash/hives, skin peeling, or any reaction on the inside of your mouth or nose? No  Did you need to seek medical attention at a hospital or doctor's office? Yes When did it last happen?more than 10 years If all above answers are "NO", may proceed with cephalosporin use.    Objective:  There were no vitals filed for this visit. There is no height or weight on file to calculate BMI. Constitutional Well developed. Well nourished.  Vascular Foot warm and well perfused. Capillary refill normal to all digits.   Neurologic Normal speech. Oriented to person, place, and time. Epicritic sensation to light touch grossly present bilaterally.  Dermatologic Skin healing well without signs of infection. Skin edges well coapted without signs of infection. Good range of motion noted of the first MPJ.  Orthopedic:  Mild tenderness to palpation noted about the surgical site.   Radiographs: 3 views of skeletally mature the  right foot: Good correction alignment noted.  No signs of loosening or breakage of the hardware noted.  Pins intact. Assessment:   1. Hammertoe of second toe of right foot   2. Bunion, right foot   3. Status post foot surgery    Plan:  Patient was evaluated and treated and all questions answered.  S/p foot surgery right -Progressing as expected post-operatively. -XR: See above -WB Status: She can begin transitioning to surgical shoe weightbearing as tolerated. -Sutures: None -Medications: None -Foot redressed.  Pin was removed no complication noted.  Followed by application to paramedic and a Band-Aid. -Good correction alignment noted after removal of the pain.  At  this point given that patient is clinically healed I have asked her to come back see me as needed.  If any foot and ankle issues arise in the future she will come back and see me.  No follow-ups on file.

## 2020-04-21 DIAGNOSIS — H35033 Hypertensive retinopathy, bilateral: Secondary | ICD-10-CM | POA: Diagnosis not present

## 2020-04-21 DIAGNOSIS — H3562 Retinal hemorrhage, left eye: Secondary | ICD-10-CM | POA: Diagnosis not present

## 2020-04-21 DIAGNOSIS — H31093 Other chorioretinal scars, bilateral: Secondary | ICD-10-CM | POA: Diagnosis not present

## 2020-04-21 DIAGNOSIS — Z961 Presence of intraocular lens: Secondary | ICD-10-CM | POA: Diagnosis not present

## 2020-04-21 DIAGNOSIS — H3581 Retinal edema: Secondary | ICD-10-CM | POA: Diagnosis not present

## 2020-04-21 DIAGNOSIS — H3509 Other intraretinal microvascular abnormalities: Secondary | ICD-10-CM | POA: Diagnosis not present

## 2020-06-10 DIAGNOSIS — H401134 Primary open-angle glaucoma, bilateral, indeterminate stage: Secondary | ICD-10-CM | POA: Diagnosis not present

## 2020-06-16 DIAGNOSIS — H3562 Retinal hemorrhage, left eye: Secondary | ICD-10-CM | POA: Diagnosis not present

## 2020-06-16 DIAGNOSIS — H3581 Retinal edema: Secondary | ICD-10-CM | POA: Diagnosis not present

## 2020-06-16 DIAGNOSIS — H35033 Hypertensive retinopathy, bilateral: Secondary | ICD-10-CM | POA: Diagnosis not present

## 2020-06-16 DIAGNOSIS — H34823 Venous engorgement, bilateral: Secondary | ICD-10-CM | POA: Diagnosis not present

## 2020-06-16 DIAGNOSIS — H31093 Other chorioretinal scars, bilateral: Secondary | ICD-10-CM | POA: Diagnosis not present

## 2020-06-16 DIAGNOSIS — H35372 Puckering of macula, left eye: Secondary | ICD-10-CM | POA: Diagnosis not present

## 2020-08-06 ENCOUNTER — Other Ambulatory Visit: Payer: Self-pay | Admitting: Podiatry

## 2020-08-06 DIAGNOSIS — M21619 Bunion of unspecified foot: Secondary | ICD-10-CM

## 2020-08-06 DIAGNOSIS — M2041 Other hammer toe(s) (acquired), right foot: Secondary | ICD-10-CM

## 2020-09-29 DIAGNOSIS — R339 Retention of urine, unspecified: Secondary | ICD-10-CM | POA: Diagnosis not present

## 2020-09-29 DIAGNOSIS — M179 Osteoarthritis of knee, unspecified: Secondary | ICD-10-CM | POA: Diagnosis not present

## 2020-09-29 DIAGNOSIS — I10 Essential (primary) hypertension: Secondary | ICD-10-CM | POA: Diagnosis not present

## 2020-09-29 DIAGNOSIS — E7800 Pure hypercholesterolemia, unspecified: Secondary | ICD-10-CM | POA: Diagnosis not present

## 2020-09-29 DIAGNOSIS — M810 Age-related osteoporosis without current pathological fracture: Secondary | ICD-10-CM | POA: Diagnosis not present

## 2020-09-29 DIAGNOSIS — J4540 Moderate persistent asthma, uncomplicated: Secondary | ICD-10-CM | POA: Diagnosis not present

## 2020-09-29 DIAGNOSIS — M8589 Other specified disorders of bone density and structure, multiple sites: Secondary | ICD-10-CM | POA: Diagnosis not present

## 2020-09-29 DIAGNOSIS — G63 Polyneuropathy in diseases classified elsewhere: Secondary | ICD-10-CM | POA: Diagnosis not present

## 2020-09-29 DIAGNOSIS — D692 Other nonthrombocytopenic purpura: Secondary | ICD-10-CM | POA: Diagnosis not present

## 2020-09-29 DIAGNOSIS — M8580 Other specified disorders of bone density and structure, unspecified site: Secondary | ICD-10-CM | POA: Diagnosis not present

## 2020-09-29 DIAGNOSIS — M25551 Pain in right hip: Secondary | ICD-10-CM | POA: Diagnosis not present

## 2020-09-29 DIAGNOSIS — R7302 Impaired glucose tolerance (oral): Secondary | ICD-10-CM | POA: Diagnosis not present

## 2020-11-10 DIAGNOSIS — H26493 Other secondary cataract, bilateral: Secondary | ICD-10-CM | POA: Diagnosis not present

## 2020-11-10 DIAGNOSIS — H401134 Primary open-angle glaucoma, bilateral, indeterminate stage: Secondary | ICD-10-CM | POA: Diagnosis not present

## 2020-11-25 DIAGNOSIS — M65331 Trigger finger, right middle finger: Secondary | ICD-10-CM | POA: Diagnosis not present

## 2020-11-25 DIAGNOSIS — M1711 Unilateral primary osteoarthritis, right knee: Secondary | ICD-10-CM | POA: Diagnosis not present

## 2020-12-02 DIAGNOSIS — M1711 Unilateral primary osteoarthritis, right knee: Secondary | ICD-10-CM | POA: Diagnosis not present

## 2020-12-16 DIAGNOSIS — H3562 Retinal hemorrhage, left eye: Secondary | ICD-10-CM | POA: Diagnosis not present

## 2020-12-16 DIAGNOSIS — H35373 Puckering of macula, bilateral: Secondary | ICD-10-CM | POA: Diagnosis not present

## 2020-12-16 DIAGNOSIS — H35363 Drusen (degenerative) of macula, bilateral: Secondary | ICD-10-CM | POA: Diagnosis not present

## 2020-12-16 DIAGNOSIS — Z961 Presence of intraocular lens: Secondary | ICD-10-CM | POA: Diagnosis not present

## 2020-12-16 DIAGNOSIS — H35033 Hypertensive retinopathy, bilateral: Secondary | ICD-10-CM | POA: Diagnosis not present

## 2020-12-16 DIAGNOSIS — H31092 Other chorioretinal scars, left eye: Secondary | ICD-10-CM | POA: Diagnosis not present

## 2020-12-17 DIAGNOSIS — M65331 Trigger finger, right middle finger: Secondary | ICD-10-CM | POA: Diagnosis not present

## 2021-03-18 ENCOUNTER — Other Ambulatory Visit: Payer: Self-pay | Admitting: Internal Medicine

## 2021-03-18 ENCOUNTER — Other Ambulatory Visit: Payer: Self-pay

## 2021-03-18 ENCOUNTER — Ambulatory Visit
Admission: RE | Admit: 2021-03-18 | Discharge: 2021-03-18 | Disposition: A | Discharge status: Home / Self Care | Payer: Medicare PPO | Source: Ambulatory Visit | Attending: Internal Medicine | Admitting: Internal Medicine

## 2021-03-18 DIAGNOSIS — Z1231 Encounter for screening mammogram for malignant neoplasm of breast: Secondary | ICD-10-CM

## 2021-04-06 DIAGNOSIS — R7302 Impaired glucose tolerance (oral): Secondary | ICD-10-CM | POA: Diagnosis not present

## 2021-04-06 DIAGNOSIS — E7800 Pure hypercholesterolemia, unspecified: Secondary | ICD-10-CM | POA: Diagnosis not present

## 2021-04-06 DIAGNOSIS — E559 Vitamin D deficiency, unspecified: Secondary | ICD-10-CM | POA: Diagnosis not present

## 2021-04-13 DIAGNOSIS — Z23 Encounter for immunization: Secondary | ICD-10-CM | POA: Diagnosis not present

## 2021-04-13 DIAGNOSIS — R7302 Impaired glucose tolerance (oral): Secondary | ICD-10-CM | POA: Diagnosis not present

## 2021-04-13 DIAGNOSIS — I10 Essential (primary) hypertension: Secondary | ICD-10-CM | POA: Diagnosis not present

## 2021-04-13 DIAGNOSIS — R82998 Other abnormal findings in urine: Secondary | ICD-10-CM | POA: Diagnosis not present

## 2021-04-13 DIAGNOSIS — D692 Other nonthrombocytopenic purpura: Secondary | ICD-10-CM | POA: Diagnosis not present

## 2021-04-13 DIAGNOSIS — Z0000 Encounter for general adult medical examination without abnormal findings: Secondary | ICD-10-CM | POA: Diagnosis not present

## 2021-04-13 DIAGNOSIS — G63 Polyneuropathy in diseases classified elsewhere: Secondary | ICD-10-CM | POA: Diagnosis not present

## 2021-04-13 DIAGNOSIS — M8580 Other specified disorders of bone density and structure, unspecified site: Secondary | ICD-10-CM | POA: Diagnosis not present

## 2021-04-13 DIAGNOSIS — E7800 Pure hypercholesterolemia, unspecified: Secondary | ICD-10-CM | POA: Diagnosis not present

## 2021-04-13 DIAGNOSIS — J4540 Moderate persistent asthma, uncomplicated: Secondary | ICD-10-CM | POA: Diagnosis not present

## 2021-07-08 DIAGNOSIS — Z961 Presence of intraocular lens: Secondary | ICD-10-CM | POA: Diagnosis not present

## 2021-07-08 DIAGNOSIS — H3562 Retinal hemorrhage, left eye: Secondary | ICD-10-CM | POA: Diagnosis not present

## 2021-07-08 DIAGNOSIS — H31092 Other chorioretinal scars, left eye: Secondary | ICD-10-CM | POA: Diagnosis not present

## 2021-07-08 DIAGNOSIS — H35371 Puckering of macula, right eye: Secondary | ICD-10-CM | POA: Diagnosis not present

## 2021-09-24 IMAGING — MG MM DIGITAL SCREENING BILAT W/ TOMO AND CAD
6 of 10 series · 6 of 30 positions shown · non-contrast
Comparison: Previous exam(s).

CLINICAL DATA: Screening.

EXAM:
DIGITAL SCREENING BILATERAL MAMMOGRAM WITH TOMOSYNTHESIS AND CAD
TECHNIQUE: Bilateral screening digital craniocaudal and mediolateral oblique
mammograms were obtained. Bilateral screening digital breast
tomosynthesis was performed. The images were evaluated with
computer-aided detection.

[L MLO synth-2D (1 of 2)]
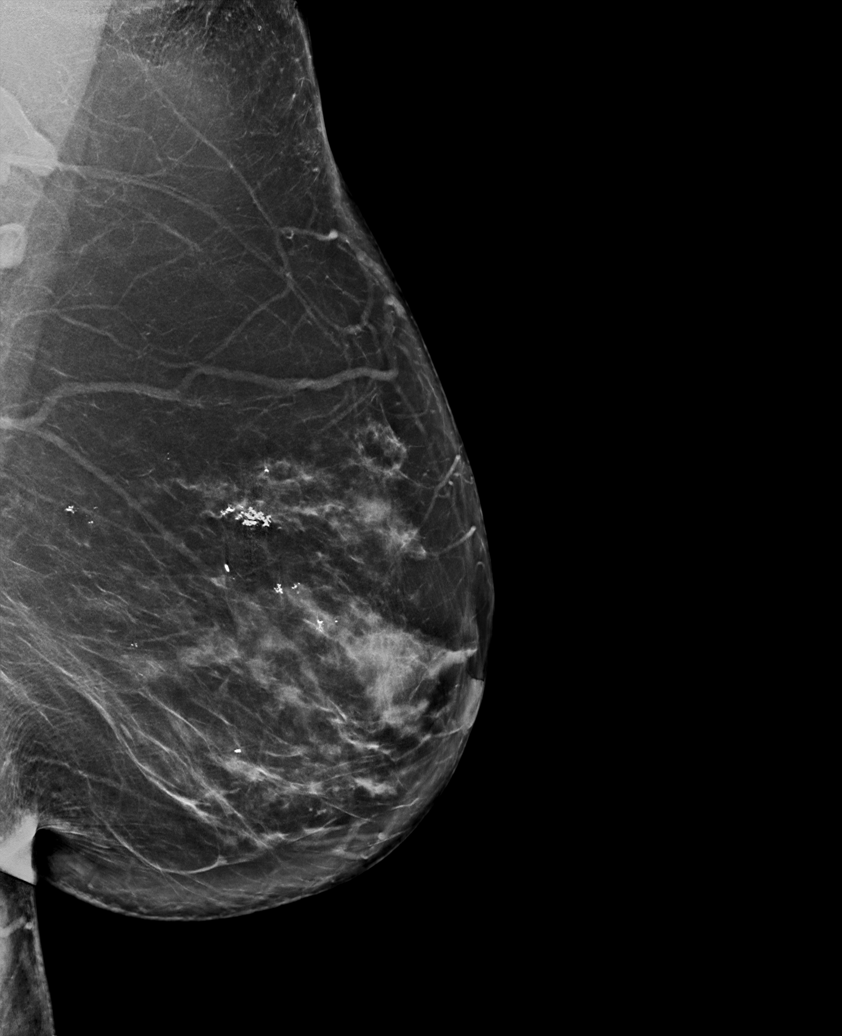

[L CC synth-2D]
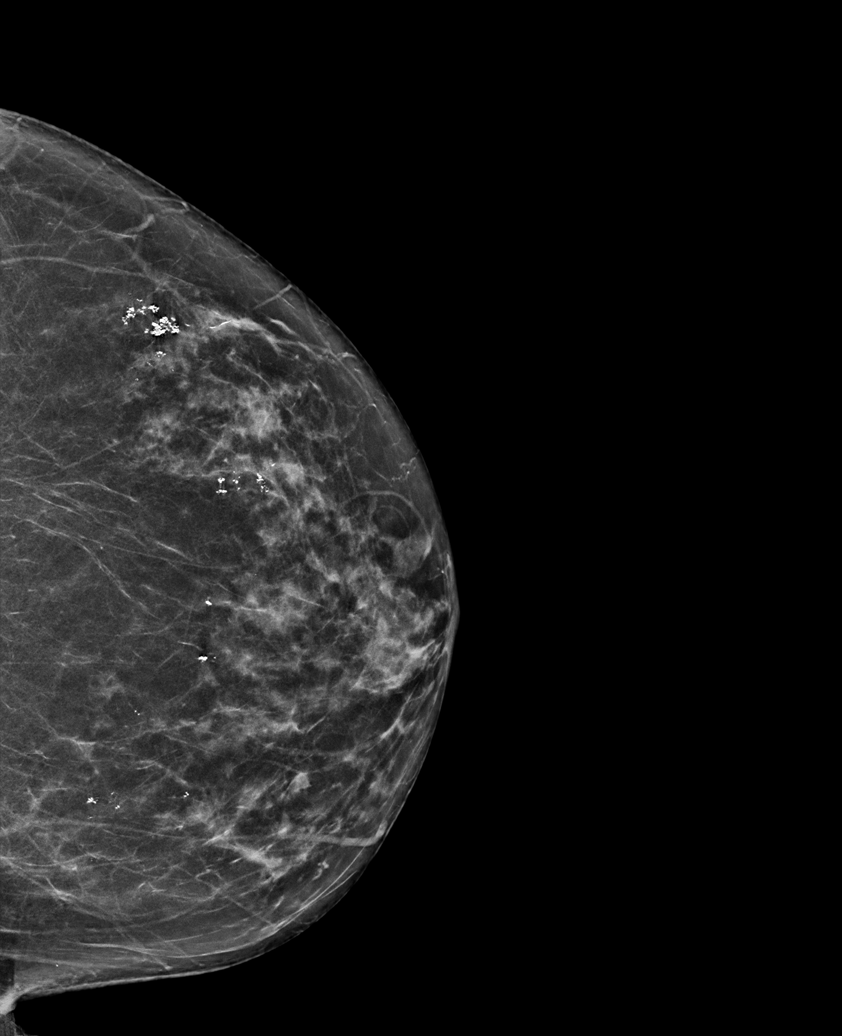

[L MLO synth-2D (2 of 2)]
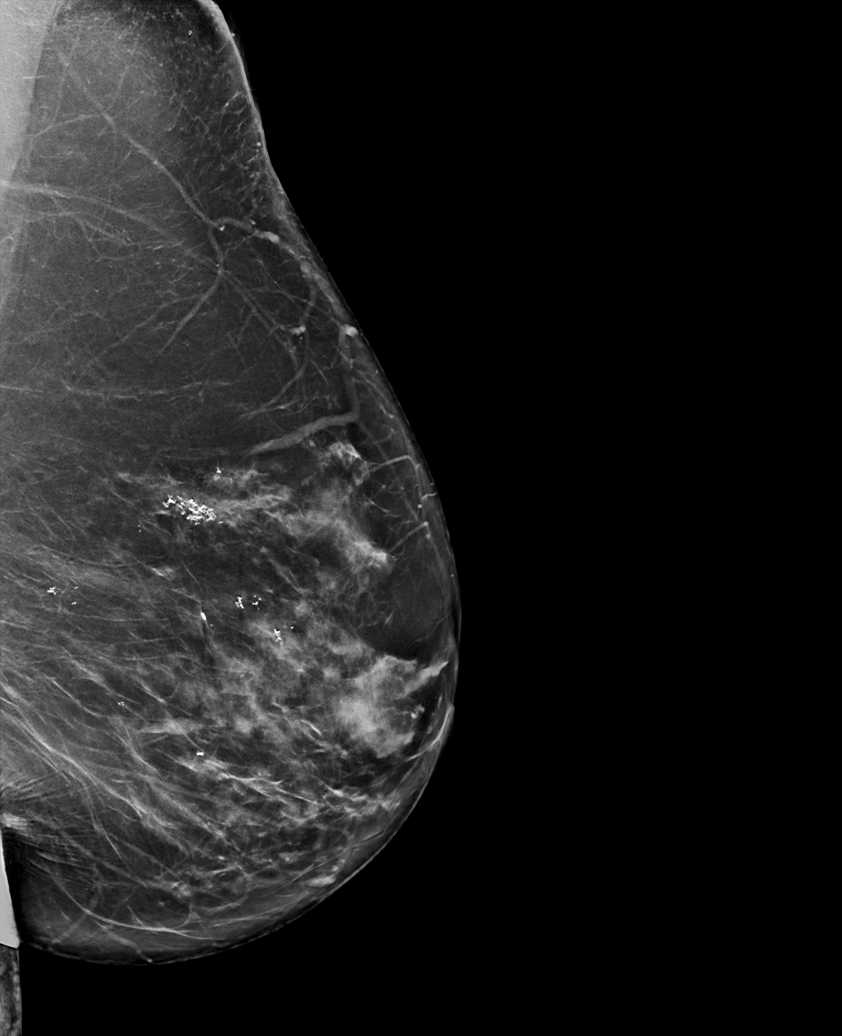

[R CC synth-2D]
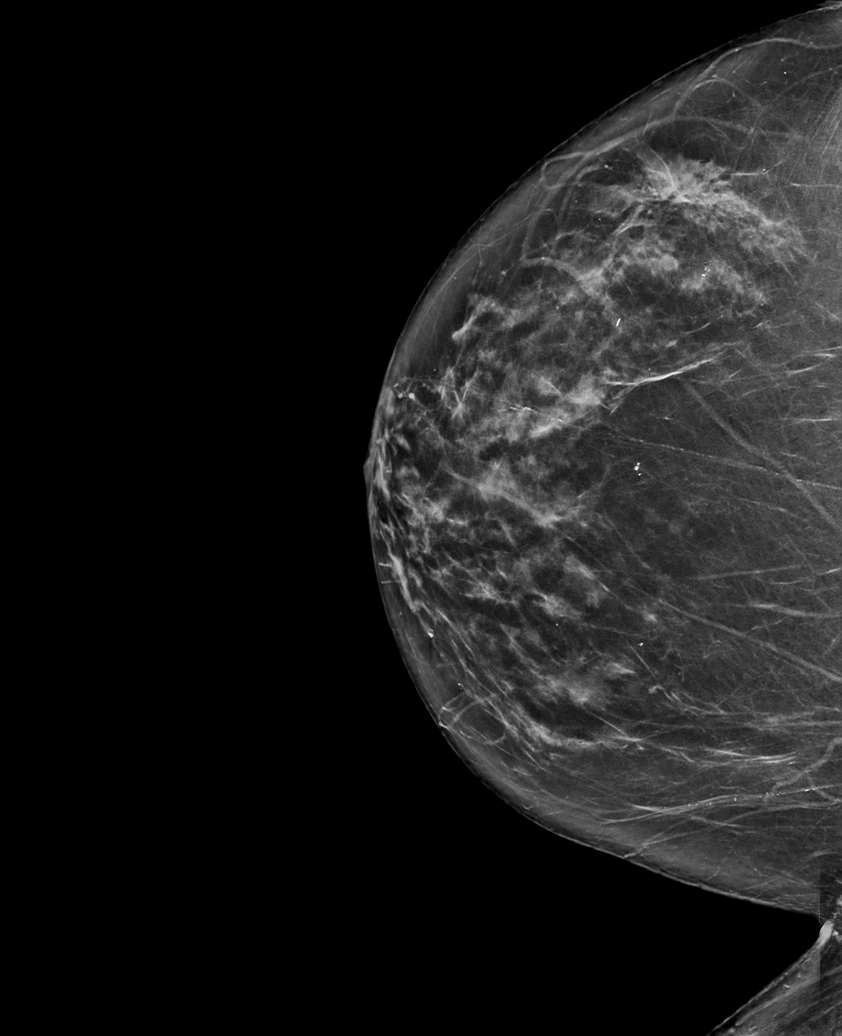

[R MLO synth-2D]
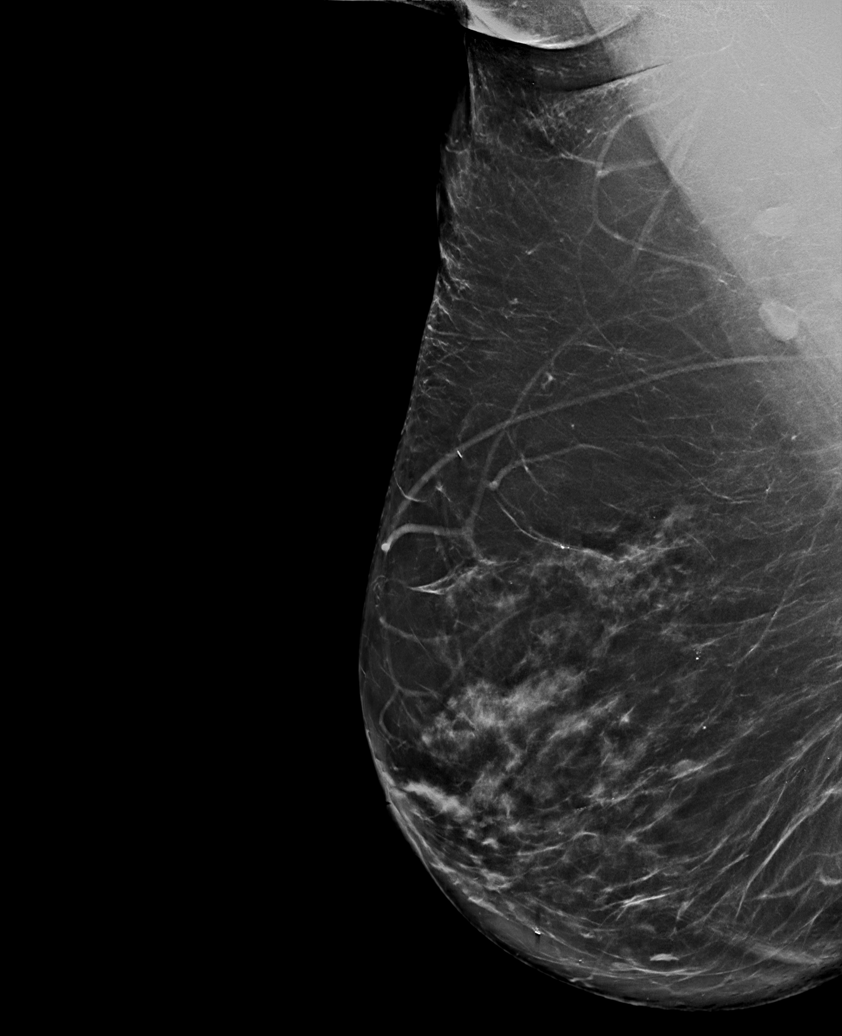

[R MLO tomo · tomo slice 45/89.0]
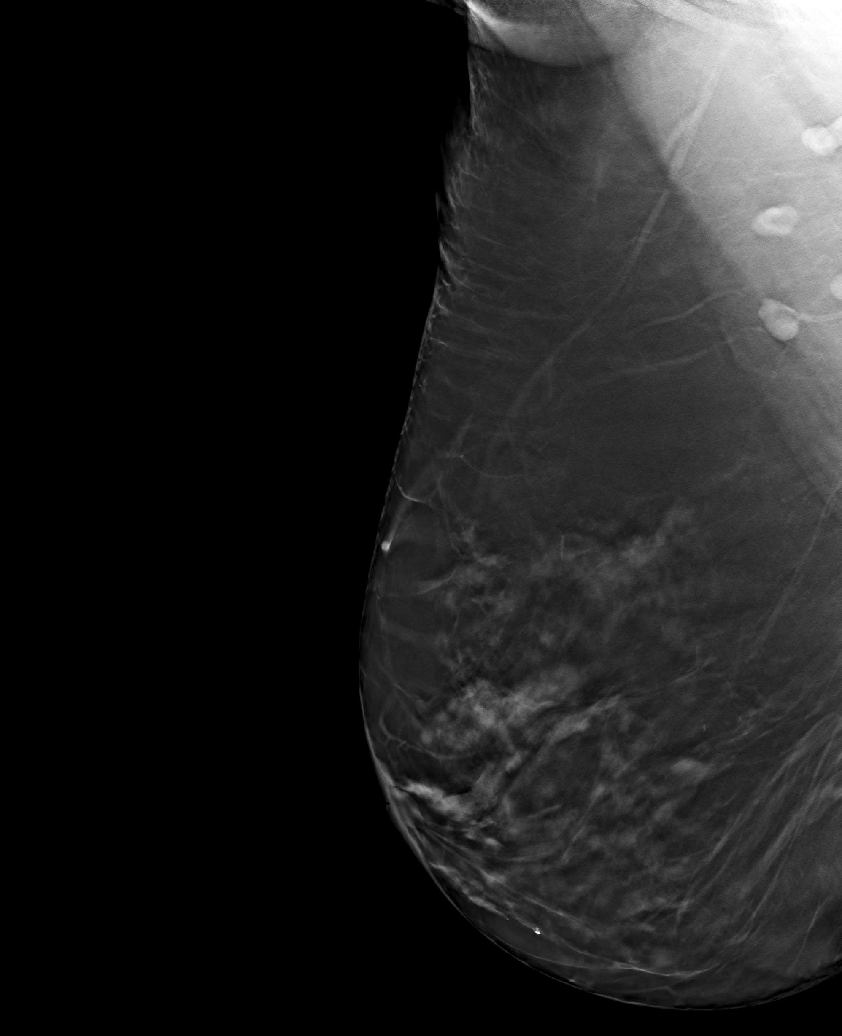

[6 of 30 positions shown; findings below may reference images not displayed]

ACR Breast Density Category c: The breast tissue is heterogeneously
dense, which may obscure small masses.
FINDINGS: There are no findings suspicious for malignancy.
IMPRESSION: No mammographic evidence of malignancy. A result letter of this
screening mammogram will be mailed directly to the patient.

RECOMMENDATION:
Screening mammogram in one year. (Code:Q3-W-BC3)

BI-RADS CATEGORY  1: Negative.

## 2021-11-01 DIAGNOSIS — M8580 Other specified disorders of bone density and structure, unspecified site: Secondary | ICD-10-CM | POA: Diagnosis not present

## 2021-11-01 DIAGNOSIS — Z1389 Encounter for screening for other disorder: Secondary | ICD-10-CM | POA: Diagnosis not present

## 2021-11-01 DIAGNOSIS — D692 Other nonthrombocytopenic purpura: Secondary | ICD-10-CM | POA: Diagnosis not present

## 2021-11-01 DIAGNOSIS — R002 Palpitations: Secondary | ICD-10-CM | POA: Diagnosis not present

## 2021-11-01 DIAGNOSIS — E7800 Pure hypercholesterolemia, unspecified: Secondary | ICD-10-CM | POA: Diagnosis not present

## 2021-11-01 DIAGNOSIS — R7302 Impaired glucose tolerance (oral): Secondary | ICD-10-CM | POA: Diagnosis not present

## 2021-11-01 DIAGNOSIS — G63 Polyneuropathy in diseases classified elsewhere: Secondary | ICD-10-CM | POA: Diagnosis not present

## 2021-11-01 DIAGNOSIS — Z1331 Encounter for screening for depression: Secondary | ICD-10-CM | POA: Diagnosis not present

## 2021-11-01 DIAGNOSIS — I10 Essential (primary) hypertension: Secondary | ICD-10-CM | POA: Diagnosis not present

## 2021-11-01 DIAGNOSIS — E559 Vitamin D deficiency, unspecified: Secondary | ICD-10-CM | POA: Diagnosis not present

## 2021-12-03 DIAGNOSIS — E113293 Type 2 diabetes mellitus with mild nonproliferative diabetic retinopathy without macular edema, bilateral: Secondary | ICD-10-CM | POA: Diagnosis not present

## 2021-12-03 DIAGNOSIS — H524 Presbyopia: Secondary | ICD-10-CM | POA: Diagnosis not present

## 2021-12-03 DIAGNOSIS — H26493 Other secondary cataract, bilateral: Secondary | ICD-10-CM | POA: Diagnosis not present

## 2021-12-03 DIAGNOSIS — H401134 Primary open-angle glaucoma, bilateral, indeterminate stage: Secondary | ICD-10-CM | POA: Diagnosis not present

## 2021-12-21 DIAGNOSIS — H26492 Other secondary cataract, left eye: Secondary | ICD-10-CM | POA: Diagnosis not present

## 2022-01-06 DIAGNOSIS — H35033 Hypertensive retinopathy, bilateral: Secondary | ICD-10-CM | POA: Diagnosis not present

## 2022-01-06 DIAGNOSIS — H3562 Retinal hemorrhage, left eye: Secondary | ICD-10-CM | POA: Diagnosis not present

## 2022-01-06 DIAGNOSIS — H35371 Puckering of macula, right eye: Secondary | ICD-10-CM | POA: Diagnosis not present

## 2022-01-06 DIAGNOSIS — H3509 Other intraretinal microvascular abnormalities: Secondary | ICD-10-CM | POA: Diagnosis not present

## 2022-01-06 DIAGNOSIS — H31092 Other chorioretinal scars, left eye: Secondary | ICD-10-CM | POA: Diagnosis not present

## 2022-01-06 DIAGNOSIS — H26491 Other secondary cataract, right eye: Secondary | ICD-10-CM | POA: Diagnosis not present

## 2022-03-02 ENCOUNTER — Other Ambulatory Visit (INDEPENDENT_AMBULATORY_CARE_PROVIDER_SITE_OTHER): Payer: Medicare PPO

## 2022-03-02 DIAGNOSIS — Z20822 Contact with and (suspected) exposure to covid-19: Secondary | ICD-10-CM

## 2022-03-02 LAB — POC COVID19 BINAXNOW: SARS Coronavirus 2 Ag: POSITIVE — AB

## 2022-03-02 NOTE — Progress Notes (Addendum)
Patient came in for a COVID-19 test after suspected exposure during a public event.  Patient test was positive. Recommended patient call her PCP for further recommendations.

## 2022-04-12 DIAGNOSIS — I10 Essential (primary) hypertension: Secondary | ICD-10-CM | POA: Diagnosis not present

## 2022-04-12 DIAGNOSIS — E559 Vitamin D deficiency, unspecified: Secondary | ICD-10-CM | POA: Diagnosis not present

## 2022-04-12 DIAGNOSIS — R7989 Other specified abnormal findings of blood chemistry: Secondary | ICD-10-CM | POA: Diagnosis not present

## 2022-04-12 DIAGNOSIS — E7800 Pure hypercholesterolemia, unspecified: Secondary | ICD-10-CM | POA: Diagnosis not present

## 2022-04-12 DIAGNOSIS — R7302 Impaired glucose tolerance (oral): Secondary | ICD-10-CM | POA: Diagnosis not present

## 2022-04-20 DIAGNOSIS — I10 Essential (primary) hypertension: Secondary | ICD-10-CM | POA: Diagnosis not present

## 2022-04-20 DIAGNOSIS — G63 Polyneuropathy in diseases classified elsewhere: Secondary | ICD-10-CM | POA: Diagnosis not present

## 2022-04-20 DIAGNOSIS — R82998 Other abnormal findings in urine: Secondary | ICD-10-CM | POA: Diagnosis not present

## 2022-04-20 DIAGNOSIS — E7800 Pure hypercholesterolemia, unspecified: Secondary | ICD-10-CM | POA: Diagnosis not present

## 2022-04-20 DIAGNOSIS — M8580 Other specified disorders of bone density and structure, unspecified site: Secondary | ICD-10-CM | POA: Diagnosis not present

## 2022-04-20 DIAGNOSIS — R7302 Impaired glucose tolerance (oral): Secondary | ICD-10-CM | POA: Diagnosis not present

## 2022-04-20 DIAGNOSIS — M179 Osteoarthritis of knee, unspecified: Secondary | ICD-10-CM | POA: Diagnosis not present

## 2022-04-20 DIAGNOSIS — D692 Other nonthrombocytopenic purpura: Secondary | ICD-10-CM | POA: Diagnosis not present

## 2022-04-20 DIAGNOSIS — R339 Retention of urine, unspecified: Secondary | ICD-10-CM | POA: Diagnosis not present

## 2022-04-20 DIAGNOSIS — Z0000 Encounter for general adult medical examination without abnormal findings: Secondary | ICD-10-CM | POA: Diagnosis not present

## 2022-04-27 DIAGNOSIS — H26491 Other secondary cataract, right eye: Secondary | ICD-10-CM | POA: Diagnosis not present

## 2022-04-27 DIAGNOSIS — H401134 Primary open-angle glaucoma, bilateral, indeterminate stage: Secondary | ICD-10-CM | POA: Diagnosis not present

## 2022-05-05 NOTE — Progress Notes (Signed)
Patient here for Bp check, She states she just completed chair yoga, and was rushing to get her flu vaccine. She is not currently on any Bp medication. It was recommended to follow up with PCP

## 2022-05-24 ENCOUNTER — Other Ambulatory Visit: Payer: Self-pay | Admitting: Internal Medicine

## 2022-05-24 DIAGNOSIS — Z1231 Encounter for screening mammogram for malignant neoplasm of breast: Secondary | ICD-10-CM

## 2022-06-04 ENCOUNTER — Ambulatory Visit
Admission: RE | Admit: 2022-06-04 | Discharge: 2022-06-04 | Disposition: A | Discharge status: Home / Self Care | Payer: Medicare PPO | Source: Ambulatory Visit | Attending: Internal Medicine | Admitting: Internal Medicine

## 2022-06-04 DIAGNOSIS — Z1231 Encounter for screening mammogram for malignant neoplasm of breast: Secondary | ICD-10-CM

## 2022-07-12 ENCOUNTER — Ambulatory Visit: Payer: Medicare PPO

## 2022-07-18 DIAGNOSIS — H35033 Hypertensive retinopathy, bilateral: Secondary | ICD-10-CM | POA: Diagnosis not present

## 2022-07-18 DIAGNOSIS — H3562 Retinal hemorrhage, left eye: Secondary | ICD-10-CM | POA: Diagnosis not present

## 2022-07-18 DIAGNOSIS — H31092 Other chorioretinal scars, left eye: Secondary | ICD-10-CM | POA: Diagnosis not present

## 2022-07-18 DIAGNOSIS — H35371 Puckering of macula, right eye: Secondary | ICD-10-CM | POA: Diagnosis not present

## 2022-08-08 DIAGNOSIS — K6289 Other specified diseases of anus and rectum: Secondary | ICD-10-CM

## 2022-08-08 HISTORY — DX: Other specified diseases of anus and rectum: K62.89

## 2022-08-31 DIAGNOSIS — H26491 Other secondary cataract, right eye: Secondary | ICD-10-CM | POA: Diagnosis not present

## 2022-08-31 DIAGNOSIS — H401134 Primary open-angle glaucoma, bilateral, indeterminate stage: Secondary | ICD-10-CM | POA: Diagnosis not present

## 2022-09-09 ENCOUNTER — Other Ambulatory Visit: Payer: Self-pay | Admitting: Cardiology

## 2022-09-09 ENCOUNTER — Encounter: Payer: Self-pay | Admitting: Cardiology

## 2022-09-09 ENCOUNTER — Other Ambulatory Visit (HOSPITAL_COMMUNITY): Payer: Self-pay | Admitting: Cardiology

## 2022-09-09 DIAGNOSIS — Z8249 Family history of ischemic heart disease and other diseases of the circulatory system: Secondary | ICD-10-CM

## 2022-09-09 DIAGNOSIS — Z131 Encounter for screening for diabetes mellitus: Secondary | ICD-10-CM

## 2022-09-09 DIAGNOSIS — Z13220 Encounter for screening for lipoid disorders: Secondary | ICD-10-CM

## 2022-09-10 LAB — LIPID PANEL W/O CHOL/HDL RATIO
Cholesterol, Total: 218 mg/dL — ABNORMAL HIGH (ref 100–199)
HDL: 53 mg/dL (ref >39)
LDL Chol Calc (NIH): 143 mg/dL — ABNORMAL HIGH (ref 0–99)
Triglycerides: 122 mg/dL (ref 0–149)
VLDL Cholesterol Cal: 22 mg/dL (ref 5–40)

## 2022-09-10 LAB — HGB A1C W/O EAG: Hgb A1c MFr Bld: 6.1 % — ABNORMAL HIGH (ref 4.8–5.6)

## 2022-09-14 ENCOUNTER — Telehealth: Payer: Self-pay

## 2022-09-14 NOTE — Telephone Encounter (Signed)
Called patient via to give lab results from free screening.  Total Cholesterol: 218 Triglycerides: 122 HDL Cholesterol: 53 LDL Cholesterol: 143 Hemoglobin A1C: 6.1  Spoke with patient about lab results from the [facility]. Per patient she has a hx of prediabetes for the past 10 years. Patient tries to watch the amount of sweets and sugars that she consumes. Patient endorses a lower carb diet. Spoke with patient about also watching the amount of fried and fatty foods consumed. Patient states that she tries to practice a heart healthy diet. Patient is on 80 mg Lipitor. Lab results will be forwarded to PCP for review. Patient is scheduled to FU w/ cardiology on 09/13/22. Patient voiced understanding.

## 2022-09-16 ENCOUNTER — Ambulatory Visit (HOSPITAL_COMMUNITY)
Admission: RE | Admit: 2022-09-16 | Discharge: 2022-09-16 | Disposition: A | Discharge status: Home / Self Care | Payer: Medicare PPO | Source: Ambulatory Visit | Attending: Cardiology | Admitting: Cardiology

## 2022-09-16 DIAGNOSIS — Z8249 Family history of ischemic heart disease and other diseases of the circulatory system: Secondary | ICD-10-CM

## 2022-09-19 ENCOUNTER — Other Ambulatory Visit: Payer: Self-pay

## 2022-09-19 ENCOUNTER — Telehealth: Payer: Self-pay | Admitting: Cardiology

## 2022-09-19 DIAGNOSIS — R911 Solitary pulmonary nodule: Secondary | ICD-10-CM

## 2022-09-19 NOTE — Telephone Encounter (Signed)
[facility] is calling to give a call report on patient. Please call back

## 2022-09-19 NOTE — Telephone Encounter (Signed)
Referral placed for pulmonology.   Thanks!

## 2022-09-19 NOTE — Telephone Encounter (Signed)
Please refer her to pulmonology.

## 2022-09-19 NOTE — Telephone Encounter (Signed)
Called [facility] back, they advised to notify MD of the report-   See below: IMPRESSION: 1. 1.7 x 1.2 cm nodule in the periphery of the left lower lobe, increased in size compared to prior CT of the abdomen and pelvis 04/24/2015, concerning for neoplasm. Outpatient referral to Pulmonology for further clinical evaluation and consideration for biopsy and/or PET-CT is strongly recommended in the near future. 2. Aortic atherosclerosis.  Will route to MD.  Thanks!

## 2022-09-20 ENCOUNTER — Encounter: Payer: Self-pay | Admitting: *Deleted

## 2022-09-20 NOTE — Progress Notes (Signed)
Pt has established PCP, Dr. Osborne Casco, whom she has seen in past year. Pt had screening Lipids and A1C done at 09/09/22 [facility] and Jonna Clark, [place] coach contacted pt to give her these results and discuss lifestyle choices to improve them. Pt requested CT (Cardiac Scoring) which Dr. Harriet Masson ordered and it was resulted on 09/17/22, with results communicated to Dr. Harriet Masson and f/u pulmonary referral ordered 09/19/22 by Dr. Terrial Rhodes office. No additional SDOH barriers noted at 09/09/22 event. No additional health equity team support indicated at this time and 60 day follow up recommended to monitor pt participation in any indicated medical f/u ordered.

## 2022-09-30 ENCOUNTER — Ambulatory Visit: Payer: Medicare PPO | Admitting: Pulmonary Disease

## 2022-09-30 ENCOUNTER — Encounter: Payer: Self-pay | Admitting: Pulmonary Disease

## 2022-09-30 VITALS — BP 128/86 | HR 68 | Ht 60.0 in | Wt 203.0 lb

## 2022-09-30 DIAGNOSIS — R911 Solitary pulmonary nodule: Secondary | ICD-10-CM

## 2022-09-30 DIAGNOSIS — J4540 Moderate persistent asthma, uncomplicated: Secondary | ICD-10-CM

## 2022-09-30 DIAGNOSIS — R1310 Dysphagia, unspecified: Secondary | ICD-10-CM

## 2022-09-30 NOTE — Progress Notes (Signed)
Synopsis: Referred in February 2024 for pulmonary nodule by Thurmon Fair, MD  Subjective:   PATIENT ID: Kirsten Horton GENDER: female DOB: 14-Dec-1940, MRN: [id number]  HPI  Chief Complaint  Patient presents with   Consult    Referred by cardiology for abnormal CT scan that showed a lung nodule. Has a history of asthma.    Kirsten Horton is an 82 year old woman, never smoker with hyperlipidemia and asthma who is referred to pulmonary clinic for lung nodule.   She had CT Cardiac Scoring scan on 09/16/22 which showed 1.7 x 2 cm nodule that has enlarged since 2016 from 7mm.   She has history of asthma and is using advair 250-74mcg 1 puff twice daily and as needed albuterol. She denies night time awakenings.   She has trouble with her swallowing since last July after she had covid. She feels like food/liquids get stuck in her throat. She has hoarse voice which has been there for years. She denies any weight loss, sweats or fevers.  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 She is a never smoker but has significant second hand smoke exposure in childhood and from her husband.   Past Medical History:  Diagnosis Date   Arthritis    Asthma    seasonal     Family History  Problem Relation Age of Onset   Breast cancer Neg Hx      Social History   Socioeconomic History   Marital status: Single    Spouse name: Not on file   Number of children: Not on file   Years of education: Not on file   Highest education level: Not on file  Occupational History   Not on file  Tobacco Use   Smoking status: Never   Smokeless tobacco: Never  Vaping Use   Vaping Use: Never used  Substance and Sexual Activity   Alcohol use: Yes    Alcohol/week: 1.0 standard drink of alcohol    Types: 1 Glasses of wine per week   Drug use: Not on file   Sexual activity: Not on file  Other Topics Concern   Not on file  Social History Narrative   Not on file    Social Determinants of Health   Financial Resource Strain: Not on file  Food Insecurity: Not on file  Transportation Needs: Not on file  Physical Activity: Not on file  Stress: Not on file  Social Connections: Not on file  Intimate Partner Violence: Not on file     Allergies  Allergen Reactions   Penicillins Hives and Rash    Did it involve swelling of the face/tongue/throat, SOB, or low BP? Yes Did it involve sudden or severe rash/hives, skin peeling, or any reaction on the inside of your mouth or nose? No  Did you need to seek medical attention at a hospital or doctor's office? Yes When did it last happen?     more than 10 years  If all above answers are "NO", may proceed with cephalosporin use.      Outpatient Medications Prior to Visit  Medication Sig Dispense Refill   acetaminophen (TYLENOL) 500 MG tablet Take 1,000 mg by mouth daily as needed for moderate pain.     albuterol (PROVENTIL HFA;VENTOLIN HFA) 108 (90 BASE) MCG/ACT inhaler Inhale 2 puffs into the lungs every 6 (six) hours as needed. For shortness of breath     CALCIUM PO Take by mouth.  Cyanocobalamin (VITAMIN B 12 PO) Take 1 tablet by mouth daily.     diclofenac sodium (VOLTAREN) 1 % GEL Apply 2 g topically 4 (four) times daily as needed. For knee pain     Fluticasone-Salmeterol (ADVAIR) 250-50 MCG/DOSE AEPB Inhale 1 puff into the lungs 2 (two) times daily.     ibuprofen (ADVIL) 800 MG tablet Take 1 tablet (800 mg total) by mouth every 6 (six) hours as needed. 60 tablet 1   latanoprost (XALATAN) 0.005 % ophthalmic solution Place 1 drop into both eyes at bedtime.  11   LIPITOR 80 MG tablet      meloxicam (MOBIC) 15 MG tablet Take 15 mg by mouth daily.     oxybutynin (DITROPAN) 5 MG tablet      traMADol (ULTRAM) 50 MG tablet Take 1 tablet (50 mg total) by mouth every 6 (six) hours as needed. 15 tablet 0   VITAMIN D PO Take by mouth.     No facility-administered medications prior to visit.   Review of  Systems  Constitutional:  Negative for chills, fever, malaise/fatigue and weight loss.  HENT:  Negative for congestion, sinus pain and sore throat.        Hoarse voice  Eyes: Negative.   Respiratory:  Positive for wheezing. Negative for cough, hemoptysis, sputum production and shortness of breath.   Cardiovascular:  Negative for chest pain, palpitations, orthopnea, claudication and leg swelling.  Gastrointestinal:  Negative for abdominal pain, heartburn, nausea and vomiting.       Dysphagia  Genitourinary: Negative.   Musculoskeletal:  Negative for joint pain and myalgias.  Skin:  Negative for rash.  Neurological:  Negative for weakness.  Endo/Heme/Allergies: Negative.   Psychiatric/Behavioral: Negative.     Objective:   Vitals:   09/30/22 0910  BP: 128/86  Pulse: 68  SpO2: 98%  Weight: 203 lb (92.1 kg)  Height: 5' (1.524 m)   Physical Exam Constitutional:      General: She is not in acute distress.    Appearance: She is not ill-appearing.  HENT:     Head: Normocephalic and atraumatic.  Eyes:     General: No scleral icterus.    Conjunctiva/sclera: Conjunctivae normal.     Pupils: Pupils are equal, round, and reactive to light.  Cardiovascular:     Rate and Rhythm: Normal rate and regular rhythm.     Pulses: Normal pulses.     Heart sounds: Normal heart sounds. No murmur heard. Pulmonary:     Effort: Pulmonary effort is normal.     Breath sounds: Normal breath sounds. No wheezing, rhonchi or rales.  Abdominal:     General: Bowel sounds are normal.     Palpations: Abdomen is soft.  Musculoskeletal:     Right lower leg: No edema.     Left lower leg: No edema.  Lymphadenopathy:     Cervical: No cervical adenopathy.  Skin:    General: Skin is warm and dry.  Neurological:     General: No focal deficit present.     Mental Status: She is alert.  Psychiatric:        Mood and Affect: Mood normal.        Behavior: Behavior normal.        Thought Content: Thought  content normal.        Judgment: Judgment normal.     CBC    Component Value Date/Time   WBC 12.6 (H) 10/20/2018 1136   RBC 3.68 (L) 10/20/2018 1136  HGB 11.3 (L) 10/20/2018 1136   HCT 36.8 10/20/2018 1136   PLT 322 10/20/2018 1136   MCV 100.0 10/20/2018 1136   MCH 30.7 10/20/2018 1136   MCHC 30.7 10/20/2018 1136   RDW 12.7 10/20/2018 1136   LYMPHSABS 1.8 10/20/2018 1136   MONOABS 1.1 (H) 10/20/2018 1136   EOSABS 0.0 10/20/2018 1136   BASOSABS 0.0 10/20/2018 1136      Latest Ref Rng & Units 10/18/2018    6:50 AM 10/17/2018   10:04 AM 10/16/2018    2:09 PM  BMP  Glucose 70 - 99 mg/dL 161   096   BUN 8 - 23 mg/dL 19   10   Creatinine 0.45 - 1.00 mg/dL 4.09  8.11  9.14   Sodium 135 - 145 mmol/L 136   137   Potassium 3.5 - 5.1 mmol/L 4.5   3.5   Chloride 98 - 111 mmol/L 98   102   CO2 22 - 32 mmol/L 29   26   Calcium 8.9 - 10.3 mg/dL 8.7   8.4    Chest imaging: CT Cardiac Scoring Scan 09/16/22 1. 1.7 x 1.2 cm nodule in the periphery of the left lower lobe, increased in size compared to prior CT of the abdomen and pelvis 04/24/2015, concerning for neoplasm. Outpatient referral to Pulmonology for further clinical evaluation and consideration for biopsy and/or PET-CT is strongly recommended in the near future. 2. Aortic atherosclerosis.  PFT:     No data to display          Labs:  Path:  Echo:  Heart Catheterization:  Assessment & Plan:   Pulmonary nodule 1 cm or greater in diameter - Plan: Pulmonary Function Test, NM PET Image Initial (PI) Skull Base To Thigh  Dysphagia, unspecified type - Plan: SLP modified barium swallow  Moderate persistent asthma without complication - Plan: Pulmonary Function Test  Discussion: Kirsten Horton is an 82 year old woman, never smoker with hyperlipidemia and asthma who is referred to pulmonary clinic for lung nodule.   She had CT Cardiac Scoring scan on 09/16/22 which showed 1.7 x 2 cm nodule that has enlarged since 2016  from 7mm. This is concerning for slow growing neoplasm. We will check a CT PET scan and then determine need for navigational bronchosocpy vs CT surgery referral for resection.   We will schedule her for PFTs in anticipation of possible surgical resection.  Her asthma is well controlled on current regimen, advair 250-53mcg 1 puff twice daily and as needed albuterol.   For her recent onset dysphagia, we will schedule her for MBS with speech therapy for further evaluation.  Follow up in 1 month.   Melody Comas, MD [facility] Office: [phone]    Current Outpatient Medications:    acetaminophen (TYLENOL) 500 MG tablet, Take 1,000 mg by mouth daily as needed for moderate pain., Disp: , Rfl:    albuterol (PROVENTIL HFA;VENTOLIN HFA) 108 (90 BASE) MCG/ACT inhaler, Inhale 2 puffs into the lungs every 6 (six) hours as needed. For shortness of breath, Disp: , Rfl:    CALCIUM PO, Take by mouth., Disp: , Rfl:    Cyanocobalamin (VITAMIN B 12 PO), Take 1 tablet by mouth daily., Disp: , Rfl:    diclofenac sodium (VOLTAREN) 1 % GEL, Apply 2 g topically 4 (four) times daily as needed. For knee pain, Disp: , Rfl:    Fluticasone-Salmeterol (ADVAIR) 250-50 MCG/DOSE AEPB, Inhale 1 puff into the lungs 2 (two) times daily.,  Disp: , Rfl:    ibuprofen (ADVIL) 800 MG tablet, Take 1 tablet (800 mg total) by mouth every 6 (six) hours as needed., Disp: 60 tablet, Rfl: 1   latanoprost (XALATAN) 0.005 % ophthalmic solution, Place 1 drop into both eyes at bedtime., Disp: , Rfl: 11   LIPITOR 80 MG tablet, , Disp: , Rfl:    meloxicam (MOBIC) 15 MG tablet, Take 15 mg by mouth daily., Disp: , Rfl:    oxybutynin (DITROPAN) 5 MG tablet, , Disp: , Rfl:    traMADol (ULTRAM) 50 MG tablet, Take 1 tablet (50 mg total) by mouth every 6 (six) hours as needed., Disp: 15 tablet, Rfl: 0   VITAMIN D PO, Take by mouth., Disp: , Rfl:

## 2022-09-30 NOTE — Patient Instructions (Addendum)
Your asthma appears well controlled. Continue advair twice daily and as needed albuterol  We will schedule you for a PET Scan to further evaluate the lung nodule. We will call you with the results and discuss whether a biopsy is recommended.  We will schedule you for a modified barium swallow to evaluate your trouble with swallowing foods/drink.  We will schedule you for pulmonary function tests at [facility].  Follow up in 1 month

## 2022-10-03 ENCOUNTER — Other Ambulatory Visit (HOSPITAL_COMMUNITY): Payer: Self-pay

## 2022-10-03 ENCOUNTER — Telehealth (HOSPITAL_COMMUNITY): Payer: Self-pay

## 2022-10-03 DIAGNOSIS — R1310 Dysphagia, unspecified: Secondary | ICD-10-CM

## 2022-10-03 NOTE — Telephone Encounter (Signed)
Attempted to contact patient to schedule Modified Barium Swallow - left voicemail. 

## 2022-10-05 ENCOUNTER — Ambulatory Visit (INDEPENDENT_AMBULATORY_CARE_PROVIDER_SITE_OTHER): Payer: Medicare PPO | Admitting: Pulmonary Disease

## 2022-10-05 DIAGNOSIS — J4540 Moderate persistent asthma, uncomplicated: Secondary | ICD-10-CM

## 2022-10-05 DIAGNOSIS — R911 Solitary pulmonary nodule: Secondary | ICD-10-CM

## 2022-10-05 LAB — PULMONARY FUNCTION TEST
DL/VA % pred: 120 %
DL/VA: 5.1 ml/min/mmHg/L
DLCO cor % pred: 83 %
DLCO cor: 13.11 ml/min/mmHg
DLCO unc % pred: 83 %
DLCO unc: 13.11 ml/min/mmHg
FEF 25-75 Post: 2.12 L/sec
FEF 25-75 Pre: 1.32 L/sec
FEF2575-%Change-Post: 60 %
FEF2575-%Pred-Post: 205 %
FEF2575-%Pred-Pre: 128 %
FEV1-%Change-Post: 15 %
FEV1-%Pred-Post: 86 %
FEV1-%Pred-Pre: 74 %
FEV1-Post: 1.23 L
FEV1-Pre: 1.06 L
FEV1FVC-%Change-Post: -5 %
FEV1FVC-%Pred-Pre: 116 %
FEV6-%Change-Post: 22 %
FEV6-%Pred-Post: 83 %
FEV6-%Pred-Pre: 68 %
FEV6-Post: 1.51 L
FEV6-Pre: 1.24 L
FEV6FVC-%Pred-Post: 107 %
FEV6FVC-%Pred-Pre: 107 %
FVC-%Change-Post: 22 %
FVC-%Pred-Post: 77 %
FVC-%Pred-Pre: 63 %
FVC-Post: 1.51 L
FVC-Pre: 1.24 L
Post FEV1/FVC ratio: 81 %
Post FEV6/FVC ratio: 100 %
Pre FEV1/FVC ratio: 86 %
Pre FEV6/FVC Ratio: 100 %
RV % pred: 75 %
RV: 1.64 L
TLC % pred: 70 %
TLC: 3.05 L

## 2022-10-05 NOTE — Progress Notes (Signed)
Full PFT Performed Today. 

## 2022-10-05 NOTE — Patient Instructions (Signed)
Full PFT Performed Today. 

## 2022-10-14 ENCOUNTER — Ambulatory Visit (HOSPITAL_COMMUNITY)
Admission: RE | Admit: 2022-10-14 | Discharge: 2022-10-14 | Disposition: A | Discharge status: Home / Self Care | Payer: Medicare PPO | Source: Ambulatory Visit | Attending: Internal Medicine | Admitting: Internal Medicine

## 2022-10-14 DIAGNOSIS — J45909 Unspecified asthma, uncomplicated: Secondary | ICD-10-CM | POA: Insufficient documentation

## 2022-10-14 DIAGNOSIS — E785 Hyperlipidemia, unspecified: Secondary | ICD-10-CM | POA: Diagnosis not present

## 2022-10-14 DIAGNOSIS — N9985 Post endometrial ablation syndrome: Secondary | ICD-10-CM | POA: Diagnosis not present

## 2022-10-14 DIAGNOSIS — R09A2 Foreign body sensation, throat: Secondary | ICD-10-CM | POA: Insufficient documentation

## 2022-10-14 DIAGNOSIS — R6339 Other feeding difficulties: Secondary | ICD-10-CM | POA: Diagnosis not present

## 2022-10-14 DIAGNOSIS — R490 Dysphonia: Secondary | ICD-10-CM | POA: Insufficient documentation

## 2022-10-14 DIAGNOSIS — Z96652 Presence of left artificial knee joint: Secondary | ICD-10-CM | POA: Insufficient documentation

## 2022-10-14 DIAGNOSIS — R0989 Other specified symptoms and signs involving the circulatory and respiratory systems: Secondary | ICD-10-CM | POA: Diagnosis not present

## 2022-10-14 DIAGNOSIS — R1310 Dysphagia, unspecified: Secondary | ICD-10-CM | POA: Diagnosis not present

## 2022-10-14 NOTE — Progress Notes (Signed)
Modified Barium Swallow Study  Patient Details  Name: Kirsten Horton MRN: [id number] Date of Birth: 1940-12-17  Today's Date: 10/14/2022  Modified Barium Swallow completed.  Full report located under Chart Review in the Imaging Section.  History of Present Illness Pt is an 82 yo female referred for MBS after reporting changes in swallowing since July 2023 after having COVID. Per MD note, pt reports foods and liquids feeling stuck in her throat and a hoarse voice. PMH includes asthma, HLD, lung nodule in L lower lobe, L total knee replacement   Clinical Impression Pt reports a globus sensation present specifically with spicy and sweet foods. She also reports new onset changes in her vocal quality (strained and hoarse) since July 2023. Oral motor exam WFL with noted missing dentition. Overall, pt has a functional oropharyngeal swallow but with minor anatomical differences.  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 Particularly with more taxing consecutive sips of thin liquids, pt has impaired timing which causes the bolus to spill over the epiglottis and results in transient penetration of thin liquids (PAS 2), which is not considered to be abnormal. No further SLP f/u for swallowing recommended at this time. Pt may benefit from ENT consult to address concerns about her voice. Factors that may increase risk of adverse event in presence of aspiration ([place] 2021): Respiratory or GI disease  Swallow Evaluation Recommendations Recommendations: PO diet PO Diet Recommendation: Regular;Thin liquids (Level 0) Liquid Administration via: Cup;Straw Medication Administration: Whole meds with liquid Supervision: Patient able to self-feed Swallowing strategies  : Slow rate;Small  bites/sips Postural changes: Position pt fully upright for meals;Stay upright 30-60 min after meals Oral care recommendations: Oral care BID (2x/day) Recommended consults: Consider ENT consultation (given changes in vocal quality; could consider SLP for voice pending ENT)    Fabio Asa., Student SLP  10/14/2022,2:27 PM

## 2022-10-19 ENCOUNTER — Ambulatory Visit (HOSPITAL_COMMUNITY)
Admission: RE | Admit: 2022-10-19 | Discharge: 2022-10-19 | Disposition: A | Discharge status: Home / Self Care | Payer: Medicare PPO | Source: Ambulatory Visit | Attending: Pulmonary Disease | Admitting: Pulmonary Disease

## 2022-10-19 DIAGNOSIS — R911 Solitary pulmonary nodule: Secondary | ICD-10-CM | POA: Insufficient documentation

## 2022-10-19 LAB — GLUCOSE, CAPILLARY: Glucose-Capillary: 98 mg/dL (ref 70–99)

## 2022-10-19 MED ORDER — FLUDEOXYGLUCOSE F - 18 (FDG) INJECTION
10.1500 | Freq: Once | INTRAVENOUS | Status: AC | PRN
  Administered 2022-10-19: 10.15 via INTRAVENOUS

## 2022-10-27 ENCOUNTER — Ambulatory Visit: Payer: Medicare PPO | Admitting: Pulmonary Disease

## 2022-10-27 DIAGNOSIS — E7800 Pure hypercholesterolemia, unspecified: Secondary | ICD-10-CM | POA: Diagnosis not present

## 2022-10-27 DIAGNOSIS — I10 Essential (primary) hypertension: Secondary | ICD-10-CM | POA: Diagnosis not present

## 2022-10-27 DIAGNOSIS — M179 Osteoarthritis of knee, unspecified: Secondary | ICD-10-CM | POA: Diagnosis not present

## 2022-10-27 DIAGNOSIS — J4540 Moderate persistent asthma, uncomplicated: Secondary | ICD-10-CM | POA: Diagnosis not present

## 2022-10-27 DIAGNOSIS — D692 Other nonthrombocytopenic purpura: Secondary | ICD-10-CM | POA: Diagnosis not present

## 2022-10-27 DIAGNOSIS — R339 Retention of urine, unspecified: Secondary | ICD-10-CM | POA: Diagnosis not present

## 2022-10-27 DIAGNOSIS — R7302 Impaired glucose tolerance (oral): Secondary | ICD-10-CM | POA: Diagnosis not present

## 2022-11-07 ENCOUNTER — Encounter: Payer: Self-pay | Admitting: Pulmonary Disease

## 2022-11-07 ENCOUNTER — Encounter: Payer: Self-pay | Admitting: Nurse Practitioner

## 2022-11-07 ENCOUNTER — Ambulatory Visit: Payer: Medicare PPO | Admitting: Pulmonary Disease

## 2022-11-07 VITALS — BP 132/76 | HR 69 | Ht 60.0 in | Wt 202.0 lb

## 2022-11-07 DIAGNOSIS — R948 Abnormal results of function studies of other organs and systems: Secondary | ICD-10-CM | POA: Diagnosis not present

## 2022-11-07 DIAGNOSIS — R911 Solitary pulmonary nodule: Secondary | ICD-10-CM

## 2022-11-07 NOTE — Patient Instructions (Signed)
We will check a CT Chest scan in 6 months to monitor your lung nodule  We will refer you to our GI doctors to have you evaluated for the rectal lesion on your PET scan  Continue Advair inhaler 1 puff twice daily.  Follow up in 6 months after CT Chest scan.

## 2022-11-07 NOTE — Progress Notes (Signed)
Synopsis: Referred in February 2024 for pulmonary nodule by Thurmon Fair, MD  Subjective:   PATIENT ID: Kirsten Horton GENDER: female DOB: 13-Dec-1940, MRN: [id number]  HPI  Chief Complaint  Patient presents with   Follow-up    F/U after PET scan   Kirsten Horton is an 82 year old woman, never smoker with hyperlipidemia and asthma who returns to pulmonary clinic for lung nodule.   NM PET scan shows 1.2cm LLL nodule with low tracer uptake SUV max of 1.36. PET scan also shows 1.8cm rectal lesion with SUV max of 8.   She denies any bloody stools, pain with defecation or abdominal pain/bloating.   PFTs show mild restrictive defect.   Initial OV 09/30/22 She had CT Cardiac Scoring scan on 09/16/22 which showed 1.7 x 2 cm nodule that has enlarged since 2016 from 7mm.   She has history of asthma and is using advair 250-75mcg 1 puff twice daily and as needed albuterol. She denies night time awakenings.   She has trouble with her swallowing since last July after she had covid. She feels like food/liquids get stuck in her throat. She has hoarse voice which has been there for years. She denies any weight loss, sweats or fevers.  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 She is a never smoker but has significant second hand smoke exposure in childhood and from her husband.   Past Medical History:  Diagnosis Date   Arthritis    Asthma    seasonal     Family History  Problem Relation Age of Onset   Breast cancer Neg Hx      Social History   Socioeconomic History   Marital status: Single    Spouse name: Not on file   Number of children: Not on file   Years of education: Not on file   Highest education level: Not on file  Occupational History   Not on file  Tobacco Use   Smoking status: Never   Smokeless tobacco: Never  Vaping Use   Vaping Use: Never used  Substance and Sexual Activity   Alcohol use: Yes    Alcohol/week: 1.0  standard drink of alcohol    Types: 1 Glasses of wine per week   Drug use: Not on file   Sexual activity: Not on file  Other Topics Concern   Not on file  Social History Narrative   Not on file   Social Determinants of Health   Financial Resource Strain: Not on file  Food Insecurity: Not on file  Transportation Needs: Not on file  Physical Activity: Not on file  Stress: Not on file  Social Connections: Not on file  Intimate Partner Violence: Not on file     Allergies  Allergen Reactions   Penicillins Hives and Rash    Did it involve swelling of the face/tongue/throat, SOB, or low BP? Yes Did it involve sudden or severe rash/hives, skin peeling, or any reaction on the inside of your mouth or nose? No  Did you need to seek medical attention at a hospital or doctor's office? Yes When did it last happen?     more than 10 years  If all above answers are "NO", may proceed with cephalosporin use.      Outpatient Medications Prior to Visit  Medication Sig Dispense Refill   acetaminophen (TYLENOL) 500 MG tablet Take 1,000 mg by mouth daily as needed for moderate pain.  albuterol (PROVENTIL HFA;VENTOLIN HFA) 108 (90 BASE) MCG/ACT inhaler Inhale 2 puffs into the lungs every 6 (six) hours as needed. For shortness of breath     CALCIUM PO Take by mouth.     Cyanocobalamin (VITAMIN B 12 PO) Take 1 tablet by mouth daily.     diclofenac sodium (VOLTAREN) 1 % GEL Apply 2 g topically 4 (four) times daily as needed. For knee pain     Fluticasone-Salmeterol (ADVAIR) 250-50 MCG/DOSE AEPB Inhale 1 puff into the lungs 2 (two) times daily.     latanoprost (XALATAN) 0.005 % ophthalmic solution Place 1 drop into both eyes at bedtime.  11   LIPITOR 80 MG tablet      meloxicam (MOBIC) 15 MG tablet Take 15 mg by mouth daily.     VITAMIN D PO Take by mouth.     ibuprofen (ADVIL) 800 MG tablet Take 1 tablet (800 mg total) by mouth every 6 (six) hours as needed. 60 tablet 1   oxybutynin (DITROPAN) 5  MG tablet      traMADol (ULTRAM) 50 MG tablet Take 1 tablet (50 mg total) by mouth every 6 (six) hours as needed. 15 tablet 0   No facility-administered medications prior to visit.   Review of Systems  Constitutional:  Negative for chills, fever, malaise/fatigue and weight loss.  HENT:  Negative for congestion, sinus pain and sore throat.   Eyes: Negative.   Respiratory:  Negative for cough, hemoptysis, sputum production, shortness of breath and wheezing.   Cardiovascular:  Negative for chest pain, palpitations, orthopnea, claudication and leg swelling.  Gastrointestinal:  Negative for abdominal pain, heartburn, nausea and vomiting.  Genitourinary: Negative.   Musculoskeletal:  Negative for joint pain and myalgias.  Skin:  Negative for rash.  Neurological:  Negative for weakness.  Endo/Heme/Allergies: Negative.   Psychiatric/Behavioral: Negative.     Objective:   Vitals:   11/07/22 0919  BP: 132/76  Pulse: 69  SpO2: 98%  Weight: 202 lb (91.6 kg)  Height: 5' (1.524 m)   Physical Exam Constitutional:      General: She is not in acute distress.    Appearance: She is obese. She is not ill-appearing.  HENT:     Head: Normocephalic and atraumatic.  Eyes:     General: No scleral icterus.    Conjunctiva/sclera: Conjunctivae normal.  Cardiovascular:     Rate and Rhythm: Normal rate and regular rhythm.     Pulses: Normal pulses.     Heart sounds: Normal heart sounds. No murmur heard. Pulmonary:     Effort: Pulmonary effort is normal.     Breath sounds: Normal breath sounds. No wheezing, rhonchi or rales.  Musculoskeletal:     Right lower leg: No edema.     Left lower leg: No edema.  Skin:    General: Skin is warm and dry.  Neurological:     General: No focal deficit present.     Mental Status: She is alert.     CBC    Component Value Date/Time   WBC 12.6 (H) 10/20/2018 1136   RBC 3.68 (L) 10/20/2018 1136   HGB 11.3 (L) 10/20/2018 1136   HCT 36.8 10/20/2018 1136    PLT 322 10/20/2018 1136   MCV 100.0 10/20/2018 1136   MCH 30.7 10/20/2018 1136   MCHC 30.7 10/20/2018 1136   RDW 12.7 10/20/2018 1136   LYMPHSABS 1.8 10/20/2018 1136   MONOABS 1.1 (H) 10/20/2018 1136   EOSABS 0.0 10/20/2018 1136   BASOSABS  0.0 10/20/2018 1136      Latest Ref Rng & Units 10/18/2018    6:50 AM 10/17/2018   10:04 AM 10/16/2018    2:09 PM  BMP  Glucose 70 - 99 mg/dL 474   259   BUN 8 - 23 mg/dL 19   10   Creatinine 5.63 - 1.00 mg/dL 8.75  6.43  3.29   Sodium 135 - 145 mmol/L 136   137   Potassium 3.5 - 5.1 mmol/L 4.5   3.5   Chloride 98 - 111 mmol/L 98   102   CO2 22 - 32 mmol/L 29   26   Calcium 8.9 - 10.3 mg/dL 8.7   8.4    Chest imaging: NM PET scan 10/19/22 1. There is a 1.2 cm nodule within the left lower lobe which has an SUV max of 1.36. On the remote CT from 04/24/2015 this measured 8 mm. Low tracer uptake favors a benign etiology such as a pulmonary hamartoma. Low-grade pulmonary neoplasm such as adenocarcinoma is not excluded. If biopsy is deferred thin close interval surveillance is advised to exclude the possibility of a slow growing pulmonary adenocarcinoma. 2. Focal area of abnormal increased uptake is identified along the anterior wall of the distal rectum. A corresponding focal area of soft tissue measures 1.8 cm on the CT images. Correlation with colon cancer screening is advised. 3. Coronary artery calcifications. 4.  Aortic Atherosclerosis  CT Cardiac Scoring Scan 09/16/22 1. 1.7 x 1.2 cm nodule in the periphery of the left lower lobe, increased in size compared to prior CT of the abdomen and pelvis 04/24/2015, concerning for neoplasm. Outpatient referral to Pulmonology for further clinical evaluation and consideration for biopsy and/or PET-CT is strongly recommended in the near future. 2. Aortic atherosclerosis.  PFT:    Latest Ref Rng & Units 10/05/2022   11:01 AM  PFT Results  FVC-Pre L 1.24   FVC-Predicted Pre % 63   FVC-Post L 1.51    FVC-Predicted Post % 77   Pre FEV1/FVC % % 86   Post FEV1/FCV % % 81   FEV1-Pre L 1.06   FEV1-Predicted Pre % 74   FEV1-Post L 1.23   DLCO uncorrected ml/min/mmHg 13.11   DLCO UNC% % 83   DLCO corrected ml/min/mmHg 13.11   DLCO COR %Predicted % 83   DLVA Predicted % 120   TLC L 3.05   TLC % Predicted % 70   RV % Predicted % 75     Labs:  Path:  Echo:  Heart Catheterization:  Assessment & Plan:   Pulmonary nodule 1 cm or greater in diameter - Plan: CT Chest Wo Contrast  Abnormal PET scan of colon - Plan: Ambulatory referral to Gastroenterology  Discussion: Kirsten Horton is an 82 year old woman, never smoker with hyperlipidemia and asthma who returns to pulmonary clinic for lung nodule.    She had CT Cardiac Scoring scan on 09/16/22 which showed 1.7 x 2 cm nodule that has enlarged since 2016 from 7mm. NM PET scan 10/19/22 showed 1.2cm nodule with minimal uptake. I discussed these findings with patient and she wishes to monitor the nodule via serial CT chest scans at this time rather than undergoing biopsy.   We discussed her abnormal findings with the 1.8cm lesion of the rectum with increased uptake. I will refer her to GI for further evaluation with colonoscopy and likely biopsies.  Her asthma is well controlled on current regimen, advair 250-68mcg 1 puff twice daily and  as needed albuterol.   Follow up in 6 months with repeat CT scan to monitor lung nodule.  Melody Comas, MD [facility] Office: [phone]    Current Outpatient Medications:    acetaminophen (TYLENOL) 500 MG tablet, Take 1,000 mg by mouth daily as needed for moderate pain., Disp: , Rfl:    albuterol (PROVENTIL HFA;VENTOLIN HFA) 108 (90 BASE) MCG/ACT inhaler, Inhale 2 puffs into the lungs every 6 (six) hours as needed. For shortness of breath, Disp: , Rfl:    CALCIUM PO, Take by mouth., Disp: , Rfl:    Cyanocobalamin (VITAMIN B 12 PO), Take 1 tablet by mouth daily., Disp:  , Rfl:    diclofenac sodium (VOLTAREN) 1 % GEL, Apply 2 g topically 4 (four) times daily as needed. For knee pain, Disp: , Rfl:    Fluticasone-Salmeterol (ADVAIR) 250-50 MCG/DOSE AEPB, Inhale 1 puff into the lungs 2 (two) times daily., Disp: , Rfl:    latanoprost (XALATAN) 0.005 % ophthalmic solution, Place 1 drop into both eyes at bedtime., Disp: , Rfl: 11   LIPITOR 80 MG tablet, , Disp: , Rfl:    meloxicam (MOBIC) 15 MG tablet, Take 15 mg by mouth daily., Disp: , Rfl:    VITAMIN D PO, Take by mouth., Disp: , Rfl:

## 2022-11-17 ENCOUNTER — Encounter: Payer: Self-pay | Admitting: *Deleted

## 2022-11-17 NOTE — Progress Notes (Signed)
Pt attended 09/09/22 screening event w/ Cardiac Scoring CT being done as part of event f/u  on 09/16/22. Chart review at that time noted cardiac CT also revealed pulmonary nodule that had increased in size since first seen in 2016. Cariologist ordered pulmonology referral which was done 09/30/22, when PET scan, PFT's, and swallow test were also ordered and consequently performed w/ PCP f/u. Pulmonology f/u PET scan with order for CT chest and referral to gastro for PET scan finding of abnormal focal area on the anterior wall of the distal rectum. Pt has GI f/u 12/20/22. No additional health equity team support indicated at this time.

## 2022-12-20 ENCOUNTER — Ambulatory Visit: Payer: Medicare PPO | Admitting: Nurse Practitioner

## 2022-12-20 ENCOUNTER — Encounter: Payer: Self-pay | Admitting: Nurse Practitioner

## 2022-12-20 VITALS — BP 130/70 | HR 64 | Ht 61.0 in | Wt 208.0 lb

## 2022-12-20 DIAGNOSIS — K629 Disease of anus and rectum, unspecified: Secondary | ICD-10-CM

## 2022-12-20 DIAGNOSIS — R948 Abnormal results of function studies of other organs and systems: Secondary | ICD-10-CM | POA: Diagnosis not present

## 2022-12-20 DIAGNOSIS — Z86010 Personal history of colonic polyps: Secondary | ICD-10-CM | POA: Diagnosis not present

## 2022-12-20 MED ORDER — NA SULFATE-K SULFATE-MG SULF 17.5-3.13-1.6 GM/177ML PO SOLN: 1.0000 | Freq: Once | ORAL | 0 refills | Status: AC

## 2022-12-20 NOTE — Progress Notes (Signed)
12/20/2022 [facility] [id number] 1940-09-11   CHIEF COMPLAINT: Abnormal PET scan, schedule colonoscopy  HISTORY OF PRESENT ILLNESS: Kirsten Horton is an 82 year old female with a past medical history of arthritis, asthma and colon polyps.  Past hysterectomy. She presents to our office today as referred by Dr. Melody Comas for further evaluation regarding abnormal PET scan which showed a potential rectal mass.  She endorsed having shortness of breath and underwent a cardiac CT 09/16/2022 which identified a pulmonary nodule.  Cardiac calcium score was 163. She was seen by pulmonology and a PET scan was completed which identified a 1.2 cm nodule in the left lower lung lobe with low tracer uptake indicating benign etiology and a focal area of abnormal increased uptake was identified to the anterior wall of the distal rectum.  She denies having any abdominal pain.  She passes a normal formed brown bowel movement daily.  No rectal bleeding or black stools.  She underwent 2 colonoscopies in her lifetime. Records in epic identified a colonoscopy by Dr. Ewing Schlein with [facility] 09/10/2003 which identified a tiny adenomatous cecal polyp and internal/external hemorrhoids.  Her second colonoscopy was 05/13/2013 which identified internal/external hemorrhoids without colon polyps.  She denies having any shortness of breath at this time and stated her asthma is well-controlled and she believes her shortness of breath symptoms were triggered after she had COVID more than 1 year ago.  She denies having any chest pain, palpitations or dizziness.  LABS: Unable to download labs from PCP at this time  PET SCAN: 1. There is a 1.2 cm nodule within the left lower lobe which has an SUV max of 1.36. On the remote CT from 04/24/2015 this measured 8 mm. Low tracer uptake favors a benign etiology such as a pulmonary hamartoma. Low-grade pulmonary neoplasm such as adenocarcinoma is not excluded. If biopsy is  deferred thin close interval surveillance is advised to exclude the possibility of a slow growing pulmonary adenocarcinoma. 2. Focal area of abnormal increased uptake is identified along the anterior wall of the distal rectum. A corresponding focal area of soft tissue measures 1.8 cm on the CT images. Correlation with colon cancer screening is advised. 3. Coronary artery calcifications. 4.  Aortic Atherosclerosis  Cardiac CT 09/16/2022: 1. Coronary calcium score of 163. This was 68th percentile for age-, race-, and sex-matched controls. 2. Aortic atherosclerosis. If CAC is >=100 or >=75th percentile, it is reasonable to initiate statin therapy at any age.  Chest CT 09/16/2022: 1. 1.7 x 1.2 cm nodule in the periphery of the left lower lobe, increased in size compared to prior CT of the abdomen and pelvis 04/24/2015, concerning for neoplasm. Outpatient referral to Pulmonology for further clinical evaluation and consideration for biopsy and/or PET-CT is strongly recommended in the near future. 2. Aortic atherosclerosis.  Past Medical History:  Diagnosis Date   Arthritis    Asthma    seasonal   Past Surgical History:  Procedure Laterality Date   ABDOMINAL HYSTERECTOMY     COLONOSCOPY     FOOT ARTHROPLASTY Right    TOTAL KNEE ARTHROPLASTY  11/30/2011   Procedure: TOTAL KNEE ARTHROPLASTY;  Surgeon: Loreta Ave, MD;  Location: [facility] OR;  Service: Orthopedics;  Laterality: Left;  left total knee arthroplasty   Social History: She is single.  She has 1 son and 1 daughter.  She is retired.  Non-smoker.  No alcohol use.  No drug use.  Family History: No known family history of esophageal,  gastric or colon cancer.  Father with diabetes.  Mother with heart disease.  Allergies  Allergen Reactions   Penicillins Hives and Rash    Did it involve swelling of the face/tongue/throat, SOB, or low BP? Yes Did it involve sudden or severe rash/hives, skin peeling, or any reaction on the inside  of your mouth or nose? No  Did you need to seek medical attention at a hospital or doctor's office? Yes When did it last happen?     more than 10 years  If all above answers are "NO", may proceed with cephalosporin use.       Outpatient Encounter Medications as of 12/20/2022  Medication Sig   acetaminophen (TYLENOL) 500 MG tablet Take 1,000 mg by mouth daily as needed for moderate pain.   albuterol (PROVENTIL HFA;VENTOLIN HFA) 108 (90 BASE) MCG/ACT inhaler Inhale 2 puffs into the lungs every 6 (six) hours as needed. For shortness of breath   CALCIUM PO Take by mouth.   Cyanocobalamin (VITAMIN B 12 PO) Take 1 tablet by mouth daily.   diclofenac sodium (VOLTAREN) 1 % GEL Apply 2 g topically 4 (four) times daily as needed. For knee pain   Fluticasone-Salmeterol (ADVAIR) 250-50 MCG/DOSE AEPB Inhale 1 puff into the lungs 2 (two) times daily.   latanoprost (XALATAN) 0.005 % ophthalmic solution Place 1 drop into both eyes at bedtime.   LIPITOR 80 MG tablet    VITAMIN D PO Take by mouth.   [DISCONTINUED] meloxicam (MOBIC) 15 MG tablet Take 15 mg by mouth daily. (Patient not taking: Reported on 12/20/2022)   No facility-administered encounter medications on file as of 12/20/2022.    REVIEW OF SYSTEMS:  Gen: Denies fever, sweats or chills. No weight loss.  CV: Denies chest pain, palpitations or edema. Resp: Denies cough, shortness of breath of hemoptysis.  GI: Denies heartburn, dysphagia, stomach or lower abdominal pain. No diarrhea or constipation.  GU: + Urine leakage.  MS: + Arthritis and back pain. Derm: Denies rash, itchiness, skin lesions or unhealing ulcers. Psych: Denies depression, anxiety, memory loss or confusion. Heme: Denies bruising, easy bleeding. Neuro:  Denies headaches, dizziness or paresthesias. Endo:  Denies any problems with DM, thyroid or adrenal function.  PHYSICAL EXAM: BP 130/70   Pulse 64   Ht 5\' 1"  (1.549 m)   Wt 208 lb (94.3 kg)   SpO2 98%   BMI 39.30 kg/m   General: 82 year old female in no acute distress. Head: Normocephalic and atraumatic. Eyes: Bilateral cataracts.  Sclerae non-icteric, conjunctive pink. Ears: Normal auditory acuity. Mouth: Dentition intact. No ulcers or lesions.  Neck: Supple, no lymphadenopathy or thyromegaly.  Lungs: Clear bilaterally to auscultation without wheezes, crackles or rhonchi. Heart: Regular rate and rhythm. No murmur, rub or gallop appreciated.  Abdomen: Soft, nontender, nondistended. No masses. No hepatosplenomegaly. Normoactive bowel sounds x 4 quadrants.  Rectal: Deferred. Musculoskeletal: Symmetrical with no gross deformities. Skin: Warm and dry. No rash or lesions on visible extremities. Extremities: No edema. Neurological: Alert oriented x 4, no focal deficits.  Psychological:  Alert and cooperative. Normal mood and affect.  ASSESSMENT AND PLAN:  82 year old female with a history of colon polyps who underwent a PET scan to survey a pulmonary nodule which identified abnormal uptake along the anterior wall of the distal rectum corresponding focal area of soft tissue measures 1.8 cm on CT images.  History of an adenomatous cecal polyp per colonoscopy in 2005.  Her most recent colonoscopy  was 05/2013 which showed hemorrhoids, no polyps. -Patient  wishes to pursue a colonoscopy at the age of 48 due to the abnormal findings per PET scan. -Colonoscopy benefits and risks discussed including risk with sedation, risk of bleeding, perforation and infection  -Further recommendations to be determined after colonoscopy completed -Check CBC and CMP if not done recently by PCP  Left lower pulmonary nodule measuring 1.2 cmm per CT, low tracer uptake per PET scan -Continue follow-up with pulmonology  History of asthma, PFTs showed mild restrictive defect on Advair daily and albuterol as needed.       CC:  [name], [name], MD

## 2022-12-20 NOTE — Patient Instructions (Addendum)
You have been scheduled for a colonoscopy. Please follow written instructions given to you at your visit today.  Please pick up your prep supplies at the pharmacy within the next 1-3 days. If you use inhalers (even only as needed), please bring them with you on the day of your procedure.   Due to recent changes in healthcare laws, you may see the results of your imaging and laboratory studies on MyChart before your provider has had a chance to review them.  We understand that in some cases there may be results that are confusing or concerning to you. Not all laboratory results come back in the same time frame and the provider may be waiting for multiple results in order to interpret others.  Please give us 48 hours in order for your provider to thoroughly review all the results before contacting the office for clarification of your results.    Thank you for trusting me with your gastrointestinal care!   Colleen Kennedy-Smith, CRNP   

## 2022-12-21 ENCOUNTER — Telehealth: Payer: Self-pay

## 2022-12-21 NOTE — Telephone Encounter (Signed)
Message Received: Percell Horton, Kirsten Carl, NP  Imogene Burn, MD; Emeline Darling, RN [name], please contact the patient let her know I was not able to locate any recent laboratory studies from her PCP or other provider.  Please send her to our lab in the next few days at her earliest convenience to have a CBC and CMP done.  Thank you       Office Visit 12/20/2022 Kirsten Natal, NP - [facility] Gastroenterology Diagnoses  Personal History Of Colonic Polyps (Primary) Abnormal gastrointestinal PET scan Rectal lesion Orders Signed This Visit  (2) Ambulatory referral to Gastroenterology Na Sulfate-K Sulfate-Mg Sulf 17.5-3.13-1.6 GM/177ML SOLN Orders Pended This Visit  None

## 2022-12-21 NOTE — Telephone Encounter (Signed)
Left message for pt to call back  °

## 2022-12-22 NOTE — Telephone Encounter (Signed)
Left message for patient to call back  

## 2022-12-22 NOTE — Progress Notes (Signed)
I agree with the assessment and plan as outlined by Ms. Kennedy-Smith. 

## 2022-12-23 ENCOUNTER — Other Ambulatory Visit: Payer: Self-pay

## 2022-12-23 DIAGNOSIS — Z86010 Personal history of colonic polyps: Secondary | ICD-10-CM

## 2022-12-23 DIAGNOSIS — R948 Abnormal results of function studies of other organs and systems: Secondary | ICD-10-CM

## 2022-12-23 DIAGNOSIS — K629 Disease of anus and rectum, unspecified: Secondary | ICD-10-CM

## 2022-12-23 NOTE — Telephone Encounter (Signed)
Unable to reach patient. Sent [employer]. Lab orders placed.

## 2022-12-23 NOTE — Telephone Encounter (Signed)
Unable to reach patient x 3. Left voicemail for patient to return call. Letter sent to patient as well.

## 2022-12-23 NOTE — Telephone Encounter (Signed)
Patient returning Kaira's call, states she is about to be on the road so if she does not answer to leave voicemail or send message to MyChart.

## 2022-12-26 NOTE — Telephone Encounter (Signed)
Noted  

## 2022-12-26 NOTE — Telephone Encounter (Signed)
Inbound call from patient, advised her of message. Patient states she will come by to get labs.

## 2022-12-28 DIAGNOSIS — H401134 Primary open-angle glaucoma, bilateral, indeterminate stage: Secondary | ICD-10-CM | POA: Diagnosis not present

## 2023-01-20 ENCOUNTER — Encounter: Payer: Medicare PPO | Admitting: Internal Medicine

## 2023-01-31 ENCOUNTER — Other Ambulatory Visit (INDEPENDENT_AMBULATORY_CARE_PROVIDER_SITE_OTHER): Payer: Medicare PPO

## 2023-01-31 DIAGNOSIS — K629 Disease of anus and rectum, unspecified: Secondary | ICD-10-CM

## 2023-01-31 DIAGNOSIS — Z86010 Personal history of colonic polyps: Secondary | ICD-10-CM | POA: Diagnosis not present

## 2023-01-31 DIAGNOSIS — R948 Abnormal results of function studies of other organs and systems: Secondary | ICD-10-CM | POA: Diagnosis not present

## 2023-01-31 LAB — COMPREHENSIVE METABOLIC PANEL
ALT: 10 U/L (ref 0–35)
AST: 13 U/L (ref 0–37)
Albumin: 3.9 g/dL (ref 3.5–5.2)
Alkaline Phosphatase: 108 U/L (ref 39–117)
BUN: 8 mg/dL (ref 6–23)
CO2: 28 mEq/L (ref 19–32)
Calcium: 8.8 mg/dL (ref 8.4–10.5)
Chloride: 104 mEq/L (ref 96–112)
Creatinine, Ser: 0.6 mg/dL (ref 0.40–1.20)
GFR: 83.63 mL/min (ref >60.00)
Glucose, Bld: 85 mg/dL (ref 70–99)
Potassium: 3.8 mEq/L (ref 3.5–5.1)
Sodium: 140 mEq/L (ref 135–145)
Total Bilirubin: 0.5 mg/dL (ref 0.2–1.2)
Total Protein: 7.3 g/dL (ref 6.0–8.3)

## 2023-01-31 LAB — CBC
HCT: 36 % (ref 36.0–46.0)
Hemoglobin: 11.8 g/dL — ABNORMAL LOW (ref 12.0–15.0)
MCHC: 32.7 g/dL (ref 30.0–36.0)
MCV: 97.6 fl (ref 78.0–100.0)
Platelets: 256 10*3/uL (ref 150.0–400.0)
RBC: 3.69 Mil/uL — ABNORMAL LOW (ref 3.87–5.11)
RDW: 13.6 % (ref 11.5–15.5)
WBC: 8.3 10*3/uL (ref 4.0–10.5)

## 2023-02-01 ENCOUNTER — Encounter: Payer: Self-pay | Admitting: Internal Medicine

## 2023-02-01 ENCOUNTER — Ambulatory Visit (AMBULATORY_SURGERY_CENTER): Payer: Medicare PPO | Admitting: Internal Medicine

## 2023-02-01 VITALS — BP 160/75 | HR 61 | Temp 97.8°F | Resp 11 | Ht 61.0 in | Wt 208.0 lb

## 2023-02-01 DIAGNOSIS — D120 Benign neoplasm of cecum: Secondary | ICD-10-CM

## 2023-02-01 DIAGNOSIS — K6289 Other specified diseases of anus and rectum: Secondary | ICD-10-CM

## 2023-02-01 DIAGNOSIS — Z86010 Personal history of colonic polyps: Secondary | ICD-10-CM

## 2023-02-01 DIAGNOSIS — Z09 Encounter for follow-up examination after completed treatment for conditions other than malignant neoplasm: Secondary | ICD-10-CM | POA: Diagnosis not present

## 2023-02-01 DIAGNOSIS — D123 Benign neoplasm of transverse colon: Secondary | ICD-10-CM

## 2023-02-01 DIAGNOSIS — D122 Benign neoplasm of ascending colon: Secondary | ICD-10-CM

## 2023-02-01 DIAGNOSIS — J45909 Unspecified asthma, uncomplicated: Secondary | ICD-10-CM | POA: Diagnosis not present

## 2023-02-01 MED ORDER — SODIUM CHLORIDE 0.9 % IV SOLN: 500.0000 mL | Freq: Once | INTRAVENOUS | Status: DC

## 2023-02-01 NOTE — Progress Notes (Signed)
Called to room to assist during endoscopic procedure.  Patient ID and intended procedure confirmed with present staff. Received instructions for my participation in the procedure from the performing physician.  

## 2023-02-01 NOTE — Progress Notes (Signed)
Report to PACU, RN, vss, BBS= Clear.  

## 2023-02-01 NOTE — Progress Notes (Signed)
Pt's states no medical or surgical changes since previsit or office visit. 

## 2023-02-01 NOTE — Progress Notes (Signed)
GASTROENTEROLOGY PROCEDURE H&P NOTE   Primary Care Physician: Gaspar Garbe, MD    Reason for Procedure:   Colon cancer screening, abnormal PET/CT scan with uptake in the distal rectum  Plan:    Colonoscopy  Patient is appropriate for endoscopic procedure(s) in the ambulatory ([facility]) setting.  The nature of the procedure, as well as the risks, benefits, and alternatives were carefully and thoroughly reviewed with the patient. Ample time for discussion and questions allowed. The patient understood, was satisfied, and agreed to proceed.     HPI: Kirsten Horton is a 82 y.o. female who presents for colonoscopy for evaluation of colon cancer screening, abnormal PET/CT scan with uptake in the distal rectum.  Patient was most recently seen in the Gastroenterology Clinic on 12/20/22.  No interval change in medical history since that appointment. Please refer to that note for full details regarding GI history and clinical presentation.   Past Medical History:  Diagnosis Date   Arthritis    Asthma    seasonal    Past Surgical History:  Procedure Laterality Date   ABDOMINAL HYSTERECTOMY     COLONOSCOPY     FOOT ARTHROPLASTY Right    TOTAL KNEE ARTHROPLASTY  11/30/2011   Procedure: TOTAL KNEE ARTHROPLASTY;  Surgeon: Loreta Ave, MD;  Location: [facility] OR;  Service: Orthopedics;  Laterality: Left;  left total knee arthroplasty    Prior to Admission medications   Medication Sig Start Date End Date Taking? Authorizing Provider  CALCIUM PO Take by mouth.   Yes [provider]  Cyanocobalamin (VITAMIN B 12 PO) Take 1 tablet by mouth daily.   Yes [provider]  latanoprost (XALATAN) 0.005 % ophthalmic solution Place 1 drop into both eyes at bedtime. 02/26/15  Yes [provider]  VITAMIN D PO Take by mouth.   Yes [provider]  acetaminophen (TYLENOL) 500 MG tablet Take 1,000 mg by mouth daily as needed for moderate pain.    [provider]  albuterol (PROVENTIL HFA;VENTOLIN HFA) 108 (90 BASE) MCG/ACT inhaler Inhale 2 puffs into the lungs every 6 (six) hours as needed. For shortness of breath    [provider]  diclofenac sodium (VOLTAREN) 1 % GEL Apply 2 g topically 4 (four) times daily as needed. For knee pain 09/05/18   [provider]  Fluticasone-Salmeterol (ADVAIR) 250-50 MCG/DOSE AEPB Inhale 1 puff into the lungs 2 (two) times daily.    [provider]  LIPITOR 80 MG tablet  08/10/19   [provider]    Current Outpatient Medications  Medication Sig Dispense Refill   CALCIUM PO Take by mouth.     Cyanocobalamin (VITAMIN B 12 PO) Take 1 tablet by mouth daily.     latanoprost (XALATAN) 0.005 % ophthalmic solution Place 1 drop into both eyes at bedtime.  11   VITAMIN D PO Take by mouth.     acetaminophen (TYLENOL) 500 MG tablet Take 1,000 mg by mouth daily as needed for moderate pain.     albuterol (PROVENTIL HFA;VENTOLIN HFA) 108 (90 BASE) MCG/ACT inhaler Inhale 2 puffs into the lungs every 6 (six) hours as needed. For shortness of breath     diclofenac sodium (VOLTAREN) 1 % GEL Apply 2 g topically 4 (four) times daily as needed. For knee pain     Fluticasone-Salmeterol (ADVAIR) 250-50 MCG/DOSE AEPB Inhale 1 puff into the lungs 2 (two) times daily.     LIPITOR 80 MG tablet  Current Facility-Administered Medications  Medication Dose Route Frequency Provider Last Rate Last Admin   0.9 %  sodium chloride infusion  500 mL Intravenous Once Imogene Burn, MD        Allergies as of 02/01/2023 - Review Complete 02/01/2023  Allergen Reaction Noted   Penicillins Hives and Rash 11/15/2011    Family History  Problem Relation Age of Onset   Heart disease Mother    Diabetes Father    Breast cancer Neg Hx     Social History   Socioeconomic History   Marital status: Single    Spouse name: Not on file   Number of children: Not on file   Years of education: Not on file    Highest education level: Not on file  Occupational History   Not on file  Tobacco Use   Smoking status: Never   Smokeless tobacco: Never  Vaping Use   Vaping Use: Never used  Substance and Sexual Activity   Alcohol use: Yes    Alcohol/week: 1.0 standard drink of alcohol    Types: 1 Glasses of wine per week   Drug use: Never   Sexual activity: Not Currently  Other Topics Concern   Not on file  Social History Narrative   Not on file   Social Determinants of Health   Financial Resource Strain: Not on file  Food Insecurity: Not on file  Transportation Needs: Not on file  Physical Activity: Not on file  Stress: Not on file  Social Connections: Not on file  Intimate Partner Violence: Not on file    Physical Exam: Vital signs in last 24 hours: BP (!) 152/67   Pulse 64   Temp 97.8 F (36.6 C)   Ht 5\' 1"  (1.549 m)   Wt 208 lb (94.3 kg)   SpO2 96%   BMI 39.30 kg/m  GEN: NAD EYE: Sclerae anicteric ENT: MMM CV: Non-tachycardic Pulm: No increased WOB GI: Soft NEURO:  Alert & Oriented   Eulah Pont, MD [facility]   02/01/2023 1:28 PM

## 2023-02-01 NOTE — Patient Instructions (Signed)
Resume previous diet and medications. Awaiting pathology results.  YOU HAD AN ENDOSCOPIC PROCEDURE TODAY AT THE [facility]:   Refer to the procedure report that was given to you for any specific questions about what was found during the examination.  If the procedure report does not answer your questions, please call your gastroenterologist to clarify.  If you requested that your care partner not be given the details of your procedure findings, then the procedure report has been included in a sealed envelope for you to review at your convenience later.  YOU SHOULD EXPECT: Some feelings of bloating in the abdomen. Passage of more gas than usual.  Walking can help get rid of the air that was put into your GI tract during the procedure and reduce the bloating. If you had a lower endoscopy (such as a colonoscopy or flexible sigmoidoscopy) you may notice spotting of blood in your stool or on the toilet paper. If you underwent a bowel prep for your procedure, you may not have a normal bowel movement for a few days.  Please Note:  You might notice some irritation and congestion in your nose or some drainage.  This is from the oxygen used during your procedure.  There is no need for concern and it should clear up in a day or so.  SYMPTOMS TO REPORT IMMEDIATELY:  Following lower endoscopy (colonoscopy or flexible sigmoidoscopy):  Excessive amounts of blood in the stool  Significant tenderness or worsening of abdominal pains  Swelling of the abdomen that is new, acute  Fever of 100F or higher  For urgent or emergent issues, a gastroenterologist can be reached at any hour by calling [phone]. Do not use MyChart messaging for urgent concerns.    DIET:  We do recommend a small meal at first, but then you may proceed to your regular diet.  Drink plenty of fluids but you should avoid alcoholic beverages for 24 hours.  ACTIVITY:  You should plan to take it easy for the rest of today and  you should NOT DRIVE or use heavy machinery until tomorrow (because of the sedation medicines used during the test).    FOLLOW UP: Our staff will call the number listed on your records the next business day following your procedure.  We will call around 7:15- 8:00 am to check on you and address any questions or concerns that you may have regarding the information given to you following your procedure. If we do not reach you, we will leave a message.     If any biopsies were taken you will be contacted by phone or by letter within the next 1-3 weeks.  Please call us at [phone] if you have not heard about the biopsies in 3 weeks.    SIGNATURES/CONFIDENTIALITY: You and/or your care partner have signed paperwork which will be entered into your electronic medical record.  These signatures attest to the fact that that the information above on your After Visit Summary has been reviewed and is understood.  Full responsibility of the confidentiality of this discharge information lies with you and/or your care-partner.  

## 2023-02-01 NOTE — Op Note (Signed)
[facility] Patient Name: Kirsten Horton Procedure Date: 02/01/2023 2:09 PM MRN: [id number] Endoscopist: Madelyn Brunner [place] , , [id number] Age: 82 Referring MD:  Date of Birth: Dec 02, 1940 Gender: Female Account #: [id number] Procedure:                Colonoscopy Indications:              Screening for colorectal malignant neoplasm,                            Incidental - Abnormal PET scan of the GI tract Medicines:                Monitored Anesthesia Care Procedure:                Pre-Anesthesia Assessment:                           - Prior to the procedure, a History and Physical                            was performed, and patient medications and                            allergies were reviewed. The patient's tolerance of                            previous anesthesia was also reviewed. The risks                            and benefits of the procedure and the sedation                            options and risks were discussed with the patient.                            All questions were answered, and informed consent                            was obtained. Prior Anticoagulants: The patient has                            taken no anticoagulant or antiplatelet agents. ASA                            Grade Assessment: II - A patient with mild systemic                            disease. After reviewing the risks and benefits,                            the patient was deemed in satisfactory condition to                            undergo the procedure.  After obtaining informed consent, the colonoscope                            was passed under direct vision. Throughout the                            procedure, the patient's blood pressure, pulse, and                            oxygen saturations were monitored continuously. The                            Olympus SN [id number] was introduced through the anus                            and  advanced to the the terminal ileum. The                            colonoscopy was performed without difficulty. The                            patient tolerated the procedure well. The quality                            of the bowel preparation was good. The terminal                            ileum, ileocecal valve, appendiceal orifice, and                            rectum were photographed. Scope In: 2:23:55 PM Scope Out: 2:42:33 PM Scope Withdrawal Time: 0 hours 16 minutes 10 seconds  Total Procedure Duration: 0 hours 18 minutes 38 seconds  Findings:                 The terminal ileum appeared normal.                           Eight sessile polyps were found in the transverse                            colon, ascending colon and cecum. The polyps were 3                            to 6 mm in size. These polyps were removed with a                            cold snare. Resection and retrieval were complete.                           One 18 mm submucosal nodule was found in the                            rectum. Biopsies were  taken with a cold forceps for                            histology.                           Non-bleeding internal hemorrhoids were found during                            retroflexion. Complications:            No immediate complications. Estimated Blood Loss:     Estimated blood loss was minimal. Impression:               - The examined portion of the ileum was normal.                           - Eight 3 to 6 mm polyps in the transverse colon,                            in the ascending colon and in the cecum, removed                            with a cold snare. Resected and retrieved.                           - Submucosal nodule in the rectum. Biopsied.                           - Non-bleeding internal hemorrhoids. Recommendation:           - Discharge patient to home (with escort).                           - Await pathology results.                            - The findings and recommendations were discussed                            with the patient. Dr Particia Lather "[address] [place],  02/01/2023 2:49:49 PM

## 2023-02-02 ENCOUNTER — Telehealth: Payer: Self-pay

## 2023-02-02 NOTE — Telephone Encounter (Signed)
  Follow up Call-     02/01/2023    1:19 PM  Call back number  Post procedure Call Back phone  # [phone]  Permission to leave phone message Yes    Post op call attempted, no answer, left WM.

## 2023-02-07 ENCOUNTER — Encounter: Payer: Self-pay | Admitting: Internal Medicine

## 2023-02-07 ENCOUNTER — Telehealth: Payer: Self-pay | Admitting: Internal Medicine

## 2023-02-07 NOTE — Telephone Encounter (Signed)
Left message for patient to call back  

## 2023-02-07 NOTE — Telephone Encounter (Signed)
Inbound call from patient states she is returning your call. Please advise.   Thank you

## 2023-02-08 NOTE — Telephone Encounter (Signed)
Left message for pt to call back  °

## 2023-02-08 NOTE — Telephone Encounter (Signed)
Patient returned your call, please advise. 

## 2023-02-10 ENCOUNTER — Other Ambulatory Visit: Payer: Self-pay

## 2023-02-10 DIAGNOSIS — K629 Disease of anus and rectum, unspecified: Secondary | ICD-10-CM

## 2023-02-10 DIAGNOSIS — D649 Anemia, unspecified: Secondary | ICD-10-CM

## 2023-02-10 DIAGNOSIS — Z86010 Personal history of colonic polyps: Secondary | ICD-10-CM

## 2023-02-10 NOTE — Telephone Encounter (Signed)
Spoke with patient regarding lab results & recommendations. Advised her on when/where to go for labs and placed orders. Pt verbalized all understanding.

## 2023-02-10 NOTE — Telephone Encounter (Signed)
Left message for patient to call back  

## 2023-02-10 NOTE — Telephone Encounter (Signed)
Patient returning call.

## 2023-02-13 ENCOUNTER — Encounter: Payer: Self-pay | Admitting: Internal Medicine

## 2023-02-13 NOTE — Progress Notes (Signed)
Attempted to call the patient twice on 7/5, and both attempts went to voicemail. I left a voicemail on her mobile number on 7/5 letting her know that I had called and would call back again. I called again today and was not able to reach her again. Both attempts to reach her mobile number went to voicemail. I left another voicemail today. I also tried to call her home number without response, and it said that her voicemail was full.   Beth, please continue attempts to call Kirsten Horton. I have discussed this case with Dr. Meridee Score, and he recommends a rectal endoscopic ultrasound for further evaluation of her rectal nodule. Please see if she would be okay with proceeding with this procedure.

## 2023-02-20 ENCOUNTER — Telehealth: Payer: Self-pay | Admitting: *Deleted

## 2023-02-20 NOTE — Telephone Encounter (Signed)
Patient called the office to see why she was initially called and no notes in patient's chart stating she was called. Nurse noted in the patients chart that future labs were noted and informed the patient of labs that are due. Patient states she will be coming to the office to have labs drawn this week.

## 2023-02-22 NOTE — Telephone Encounter (Signed)
Patient called requesting to speak with nurse, did not give any information about what the call was for. Please advise

## 2023-02-23 NOTE — Telephone Encounter (Signed)
See 02/01/23 surgical pathology note for recommendations

## 2023-03-02 ENCOUNTER — Other Ambulatory Visit: Payer: Self-pay

## 2023-03-02 DIAGNOSIS — K629 Disease of anus and rectum, unspecified: Secondary | ICD-10-CM

## 2023-04-18 DIAGNOSIS — R7302 Impaired glucose tolerance (oral): Secondary | ICD-10-CM | POA: Diagnosis not present

## 2023-04-18 DIAGNOSIS — E785 Hyperlipidemia, unspecified: Secondary | ICD-10-CM | POA: Diagnosis not present

## 2023-04-18 DIAGNOSIS — I10 Essential (primary) hypertension: Secondary | ICD-10-CM | POA: Diagnosis not present

## 2023-04-18 DIAGNOSIS — E7800 Pure hypercholesterolemia, unspecified: Secondary | ICD-10-CM | POA: Diagnosis not present

## 2023-04-18 DIAGNOSIS — E559 Vitamin D deficiency, unspecified: Secondary | ICD-10-CM | POA: Diagnosis not present

## 2023-04-26 DIAGNOSIS — I10 Essential (primary) hypertension: Secondary | ICD-10-CM | POA: Diagnosis not present

## 2023-04-26 DIAGNOSIS — D125 Benign neoplasm of sigmoid colon: Secondary | ICD-10-CM | POA: Diagnosis not present

## 2023-04-26 DIAGNOSIS — R82998 Other abnormal findings in urine: Secondary | ICD-10-CM | POA: Diagnosis not present

## 2023-04-26 DIAGNOSIS — E7800 Pure hypercholesterolemia, unspecified: Secondary | ICD-10-CM | POA: Diagnosis not present

## 2023-04-26 DIAGNOSIS — Z23 Encounter for immunization: Secondary | ICD-10-CM | POA: Diagnosis not present

## 2023-04-26 DIAGNOSIS — R7302 Impaired glucose tolerance (oral): Secondary | ICD-10-CM | POA: Diagnosis not present

## 2023-04-26 DIAGNOSIS — Z0000 Encounter for general adult medical examination without abnormal findings: Secondary | ICD-10-CM | POA: Diagnosis not present

## 2023-04-26 DIAGNOSIS — Z1331 Encounter for screening for depression: Secondary | ICD-10-CM | POA: Diagnosis not present

## 2023-04-26 DIAGNOSIS — D170 Benign lipomatous neoplasm of skin and subcutaneous tissue of head, face and neck: Secondary | ICD-10-CM | POA: Diagnosis not present

## 2023-04-26 DIAGNOSIS — Z6841 Body Mass Index (BMI) 40.0 and over, adult: Secondary | ICD-10-CM | POA: Diagnosis not present

## 2023-04-26 DIAGNOSIS — Z1339 Encounter for screening examination for other mental health and behavioral disorders: Secondary | ICD-10-CM | POA: Diagnosis not present

## 2023-04-26 DIAGNOSIS — D692 Other nonthrombocytopenic purpura: Secondary | ICD-10-CM | POA: Diagnosis not present

## 2023-04-26 NOTE — Progress Notes (Signed)
Left HIPAA compliant voicemail with call back number for Pre-op call  COVID Vaccine Completed:  Date of COVID positive in last 90 days:  PCP - Guerry Bruin, MD Cardiologist -  Pulmonologist- Melody Comas, MD LOV 11/07/22  PET- 10/19/22 Epic Chest x-ray -  EKG -  Stress Test -  ECHO -  Cardiac Cath -  Pacemaker/ICD device last checked: Spinal Cord Stimulator:  Bowel Prep -   Sleep Study -  CPAP -   Fasting Blood Sugar -  Checks Blood Sugar _____ times a day  Last dose of GLP1 agonist-  N/A GLP1 instructions:  N/A   Last dose of SGLT-2 inhibitors-  N/A SGLT-2 instructions: N/A   Blood Thinner Instructions:  Time Aspirin Instructions: Last Dose:  Activity level:  Can go up a flight of stairs and perform activities of daily living without stopping and without symptoms of chest pain or shortness of breath.  Able to exercise without symptoms  Unable to go up a flight of stairs without symptoms of     Anesthesia review:   Patient denies shortness of breath, fever, cough and chest pain at PAT appointment  Patient verbalized understanding of instructions that were given to them at the PAT appointment. Patient was also instructed that they will need to review over the PAT instructions again at home before surgery.

## 2023-04-28 ENCOUNTER — Encounter (HOSPITAL_COMMUNITY): Payer: Self-pay | Admitting: Gastroenterology

## 2023-04-28 ENCOUNTER — Other Ambulatory Visit: Payer: Self-pay

## 2023-04-28 NOTE — Progress Notes (Signed)
Left HIPAA compliant voicemail with call back number for Pre-op call  COVID Vaccine Completed:  Date of COVID positive in last 90 days:  PCP - Guerry Bruin, MD Cardiologist -  Pulmonologist- Melody Comas, MD LOV 11/07/22  PET- 10/19/22 Epic Chest x-ray -  EKG -  Stress Test -  ECHO -  Cardiac Cath -  Pacemaker/ICD device last checked: Spinal Cord Stimulator:  Bowel Prep -   Sleep Study -  CPAP -   Fasting Blood Sugar -  Checks Blood Sugar _____ times a day  Last dose of GLP1 agonist-  N/A GLP1 instructions:  N/A   Last dose of SGLT-2 inhibitors-  N/A SGLT-2 instructions: N/A   Blood Thinner Instructions:  Time Aspirin Instructions: Last Dose:  Activity level:  Can go up a flight of stairs and perform activities of daily living without stopping and without symptoms of chest pain or shortness of breath.  Able to exercise without symptoms    Anesthesia review: asthma  Patient denies shortness of breath, fever, cough and chest pain at PAT appointment  Patient verbalized understanding of instructions that were given to them at the PAT appointment. Patient was also instructed that they will need to review over the PAT instructions again at home before surgery.

## 2023-04-28 NOTE — Progress Notes (Signed)
2nd Attempt to obtain medical history. Unable to reach pt. At this time. HIPAA complaint voicemail left with pre-surgical testing number.

## 2023-05-04 ENCOUNTER — Ambulatory Visit (HOSPITAL_COMMUNITY)
Admission: RE | Admit: 2023-05-04 | Discharge: 2023-05-04 | Disposition: A | Discharge status: Home / Self Care | Payer: Medicare PPO | Attending: Gastroenterology | Admitting: Gastroenterology

## 2023-05-04 ENCOUNTER — Ambulatory Visit (HOSPITAL_BASED_OUTPATIENT_CLINIC_OR_DEPARTMENT_OTHER): Payer: Self-pay | Admitting: Anesthesiology

## 2023-05-04 ENCOUNTER — Ambulatory Visit (HOSPITAL_COMMUNITY): Payer: Self-pay | Admitting: Anesthesiology

## 2023-05-04 ENCOUNTER — Other Ambulatory Visit: Payer: Self-pay

## 2023-05-04 ENCOUNTER — Encounter (HOSPITAL_COMMUNITY)
Admission: RE | Disposition: A | Discharge status: Home / Self Care | Payer: Self-pay | Source: Home / Self Care | Attending: Gastroenterology

## 2023-05-04 ENCOUNTER — Encounter (HOSPITAL_COMMUNITY): Payer: Self-pay | Admitting: Gastroenterology

## 2023-05-04 DIAGNOSIS — K641 Second degree hemorrhoids: Secondary | ICD-10-CM

## 2023-05-04 DIAGNOSIS — K6289 Other specified diseases of anus and rectum: Secondary | ICD-10-CM | POA: Diagnosis not present

## 2023-05-04 DIAGNOSIS — Z96652 Presence of left artificial knee joint: Secondary | ICD-10-CM | POA: Diagnosis not present

## 2023-05-04 DIAGNOSIS — Z6839 Body mass index (BMI) 39.0-39.9, adult: Secondary | ICD-10-CM | POA: Diagnosis not present

## 2023-05-04 DIAGNOSIS — Z90710 Acquired absence of both cervix and uterus: Secondary | ICD-10-CM | POA: Insufficient documentation

## 2023-05-04 DIAGNOSIS — C49A5 Gastrointestinal stromal tumor of rectum: Secondary | ICD-10-CM | POA: Insufficient documentation

## 2023-05-04 DIAGNOSIS — K649 Unspecified hemorrhoids: Secondary | ICD-10-CM | POA: Diagnosis not present

## 2023-05-04 DIAGNOSIS — R933 Abnormal findings on diagnostic imaging of other parts of digestive tract: Secondary | ICD-10-CM | POA: Insufficient documentation

## 2023-05-04 DIAGNOSIS — E785 Hyperlipidemia, unspecified: Secondary | ICD-10-CM | POA: Diagnosis not present

## 2023-05-04 DIAGNOSIS — K629 Disease of anus and rectum, unspecified: Secondary | ICD-10-CM | POA: Diagnosis not present

## 2023-05-04 DIAGNOSIS — J45909 Unspecified asthma, uncomplicated: Secondary | ICD-10-CM | POA: Diagnosis not present

## 2023-05-04 DIAGNOSIS — C7A026 Malignant carcinoid tumor of the rectum: Secondary | ICD-10-CM | POA: Diagnosis not present

## 2023-05-04 DIAGNOSIS — K644 Residual hemorrhoidal skin tags: Secondary | ICD-10-CM | POA: Diagnosis not present

## 2023-05-04 HISTORY — PX: EUS: SHX5427

## 2023-05-04 HISTORY — DX: Pneumonia, unspecified organism: J18.9

## 2023-05-04 HISTORY — PX: FINE NEEDLE ASPIRATION: SHX5430

## 2023-05-04 HISTORY — DX: Disease of anus and rectum, unspecified: K62.9

## 2023-05-04 HISTORY — PX: FLEXIBLE SIGMOIDOSCOPY: SHX5431

## 2023-05-04 SURGERY — ULTRASOUND, LOWER GI TRACT, ENDOSCOPIC
Anesthesia: Monitor Anesthesia Care

## 2023-05-04 MED ORDER — METRONIDAZOLE 500 MG PO TABS: 500.0000 mg | ORAL_TABLET | Freq: Two times a day (BID) | ORAL | 0 refills | Status: AC

## 2023-05-04 MED ORDER — LABETALOL HCL 5 MG/ML IV SOLN
INTRAVENOUS | Status: AC
  Filled 2023-05-04: qty 4

## 2023-05-04 MED ORDER — LIDOCAINE 2% (20 MG/ML) 5 ML SYRINGE
INTRAMUSCULAR | Status: DC | PRN
  Administered 2023-05-04: 100 mg via INTRAVENOUS

## 2023-05-04 MED ORDER — LABETALOL HCL 5 MG/ML IV SOLN
5.0000 mg | INTRAVENOUS | Status: DC | PRN
  Administered 2023-05-04: 5 mg via INTRAVENOUS

## 2023-05-04 MED ORDER — LACTATED RINGERS IV SOLN
INTRAVENOUS | Status: AC | PRN
Start: 2023-05-04 — End: 2023-05-04
  Administered 2023-05-04: 1000 mL via INTRAVENOUS

## 2023-05-04 MED ORDER — PROPOFOL 500 MG/50ML IV EMUL
INTRAVENOUS | Status: DC | PRN
  Administered 2023-05-04: 125 ug/kg/min via INTRAVENOUS

## 2023-05-04 MED ORDER — CIPROFLOXACIN IN D5W 400 MG/200ML IV SOLN
INTRAVENOUS | Status: AC
  Filled 2023-05-04: qty 200

## 2023-05-04 MED ORDER — CIPROFLOXACIN IN D5W 400 MG/200ML IV SOLN
INTRAVENOUS | Status: DC | PRN
Start: 2023-05-04 — End: 2023-05-04
  Administered 2023-05-04: 400 mg via INTRAVENOUS

## 2023-05-04 MED ORDER — PROPOFOL 10 MG/ML IV BOLUS
INTRAVENOUS | Status: AC
  Filled 2023-05-04: qty 20

## 2023-05-04 MED ORDER — PROPOFOL 10 MG/ML IV BOLUS
INTRAVENOUS | Status: DC | PRN
  Administered 2023-05-04 (×2): 20 mg via INTRAVENOUS

## 2023-05-04 MED ORDER — CIPROFLOXACIN HCL 500 MG PO TABS: 500.0000 mg | ORAL_TABLET | Freq: Two times a day (BID) | ORAL | 0 refills | Status: AC

## 2023-05-04 MED ORDER — SODIUM CHLORIDE 0.9 % IV SOLN: INTRAVENOUS | Status: DC

## 2023-05-04 NOTE — Discharge Instructions (Signed)

## 2023-05-04 NOTE — Op Note (Signed)
[facility] Patient Name: Kirsten Horton Procedure Date: 05/04/2023 MRN: [id number] Attending MD: Corliss Parish , MD, [id number] Date of Birth: Apr 01, 1941 CSN: [id number] Age: 82 Admit Type: Outpatient Procedure:                Lower EUS Indications:              Rectal deformity found on endoscopy; subepithelial                            tumor versus extrinsic compression, Abnormal PET                            scan of the rectum Providers:                Corliss Parish, MD, Fransisca Connors, Harrington Challenger, Technician Referring MD:             Madelyn Brunner" Leonides Schanz Medicines:                Monitored Anesthesia Care, Cipro 400 mg IV Complications:            No immediate complications. Estimated Blood Loss:     Estimated blood loss was minimal. Procedure:                Pre-Anesthesia Assessment:                           - Prior to the procedure, a History and Physical                            was performed, and patient medications and                            allergies were reviewed. The patient's tolerance of                            previous anesthesia was also reviewed. The risks                            and benefits of the procedure and the sedation                            options and risks were discussed with the patient.                            All questions were answered, and informed consent                            was obtained. Prior Anticoagulants: The patient has                            taken no anticoagulant or antiplatelet agents. ASA  Grade Assessment: III - A patient with severe                            systemic disease. After reviewing the risks and                            benefits, the patient was deemed in satisfactory                            condition to undergo the procedure.                           After obtaining informed consent, the endoscope was                             passed under direct vision. Throughout the                            procedure, the patient's blood pressure, pulse, and                            oxygen saturations were monitored continuously. The                            [id number]-[id number] ([id number]) Olympus therapeutic endoscope                            was introduced through the anus and advanced to the                            the descending colon. The [id number] ([id number])                            Olympus radial ultrasound scope was introduced                            through the anus and advanced to the the sigmoid                            colon for ultrasound. The [id number] ([id number])                            Olympus linear ultrasound scope was introduced                            through the anus and advanced to the the rectum for                            ultrasound. The lower EUS was accomplished without                            difficulty. The patient tolerated the procedure. Scope In: 4:08:45 PM Scope Out: 4:13:21 PM Total Procedure Duration: 0 hours 4 minutes 36 seconds  Findings:      The digital rectal exam findings include palpable rectal nodule.      ENDOSCOPIC FINDING: :      One 18 mm subepithelial nodule was found in the distal rectum.      Normal mucosa was found in the entire colon.      Non-bleeding non-thrombosed external and internal hemorrhoids were found       during retroflexion, during perianal exam and during digital exam. The       hemorrhoids were Grade II (internal hemorrhoids that prolapse but reduce       spontaneously).      ENDOSONOGRAPHIC FINDING: :      An oval intramural (subepithelial) lesion was found in the rectum. The       lesion was encountered at 3 cm (from the anal verge). The lesion was       non-circumferential and located predominantly at the left-anterior       rectal wall. The lesion was heterogenous with some concern that there       could be some  fluid within this lesion in the MP. Sonographically, the       origin appeared to be within the muscularis propria (Layer 4). No       additional wall layers were involved. The lesion measured up to 19 mm in       thickness. The endosonographic borders were well-defined. Diagnostic       needle aspiration for fluid vs tissue was performed. Color Doppler       imaging was utilized prior to needle puncture to confirm a lack of       significant vascular structures within the needle path. Five passes were       made with the 22 gauge needle using a transrectal approach. Some passes       were made with a stylet. Sample(s) were sent for cytology.      The perirectal space was normal.      No malignant-appearing lymph nodes were visualized in the perirectal       region and in the left iliac region. The nodes were.      The internal anal sphincter was visualized endosonographically and       appeared normal. Impression:               FLEX IMPRESSION:                           - Palpable rectal nodule found on digital rectal                            exam.                           - Subepithelial nodule in the distal rectum.                           - Normal mucosa in the entire examined colon.                           - Non-bleeding non-thrombosed external and internal                            hemorrhoids.  EUS IMPRESSION:                           - An intramural (subepithelial) heterogenous lesion                            was visualized endosonographically in the rectum.                            The origin of the lesion appeared to be within the                            muscularis propria (Layer 4). Fine needle                            aspiration for fluid/tissue performed. Concern is                            that this is more fluid based rather than tissue                            (could this be inflammatory?).                           -  Endosonographic images of the perirectal space                            were unremarkable.                           - No malignant-appearing lymph nodes were                            visualized endosonographically in the perirectal                            region and in the left iliac region.                           - The internal anal sphincter was visualized                            endosonographically and appeared normal. Moderate Sedation:      Not Applicable - Patient had care per Anesthesia. Recommendation:           - The patient will be observed post-procedure,                            until all discharge criteria are met.                           - Discharge patient to home.                           - Patient has a contact number available for  emergencies. The signs and symptoms of potential                            delayed complications were discussed with the                            patient. Return to normal activities tomorrow.                            Written discharge instructions were provided to the                            patient.                           - Cipro 500 BID/Flagyl 500 BID x 3-days to decrease                            risk of post-interventional infectious risk.                           - Followup pathology.                           - Followup imaging or next steps to be discussed on                            final results.                           - The findings and recommendations were discussed                            with the patient.                           - The findings and recommendations were discussed                            with the patient's family. Procedure Code(s):        --- Professional ---                           [phone], Sigmoidoscopy, flexible; with                            transendoscopic ultrasound guided intramural or                            transmural fine needle  aspiration/biopsy(s) Diagnosis Code(s):        --- Professional ---                           K62.89, Other specified diseases of anus and rectum  K64.1, Second degree hemorrhoids                           I89.9, Noninfective disorder of lymphatic vessels                            and lymph nodes, unspecified                           R93.3, Abnormal findings on diagnostic imaging of                            other parts of digestive tract CPT copyright 2022 [employer]. All rights reserved. The codes documented in this report are preliminary and upon coder review may  be revised to meet current compliance requirements. Corliss Parish, MD 05/04/2023 5:16:06 PM Number of Addenda: 0

## 2023-05-04 NOTE — Anesthesia Postprocedure Evaluation (Signed)
Anesthesia Post Note  Patient: Kirsten Horton  Procedure(s) Performed: LOWER ENDOSCOPIC ULTRASOUND (EUS) FLEXIBLE SIGMOIDOSCOPY FINE NEEDLE ASPIRATION (FNA) LINEAR     Patient location during evaluation: PACU Anesthesia Type: MAC Level of consciousness: awake Pain management: pain level controlled Vital Signs Assessment: post-procedure vital signs reviewed and stable Respiratory status: spontaneous breathing, nonlabored ventilation and respiratory function stable Cardiovascular status: stable and blood pressure returned to baseline Postop Assessment: no apparent nausea or vomiting Anesthetic complications: no   No notable events documented.  Last Vitals:  Vitals:   05/04/23 1700 05/04/23 1703  BP: (!) 201/79 (!) 206/76  Pulse: (P) 81 71  Resp: 17 (!) 22  Temp:    SpO2: (P) 98% 98%    Last Pain:  Vitals:   05/04/23 1703  TempSrc:   PainSc: (P) 8                  Linton Rump

## 2023-05-04 NOTE — H&P (Signed)
GASTROENTEROLOGY PROCEDURE H&P NOTE   Primary Care Physician: [name], [name], MD  HPI: Kirsten Horton is a 82 y.o. female who presents for Lower EUS to evaluate Rectal SEL and abnormal PET-CT.  Past Medical History:  Diagnosis Date   Arthritis    Asthma    seasonal   Pneumonia    Past Surgical History:  Procedure Laterality Date   ABDOMINAL HYSTERECTOMY     COLONOSCOPY     FOOT ARTHROPLASTY Right    TOTAL KNEE ARTHROPLASTY  11/30/2011   Procedure: TOTAL KNEE ARTHROPLASTY;  Surgeon: Loreta Ave, MD;  Location: [facility] OR;  Service: Orthopedics;  Laterality: Left;  left total knee arthroplasty   Current Facility-Administered Medications  Medication Dose Route Frequency Provider Last Rate Last Admin   0.9 %  sodium chloride infusion   Intravenous Continuous Mansouraty, Netty Starring., MD       lactated ringers infusion    Continuous PRN Mansouraty, Netty Starring., MD 10 mL/hr at 05/04/23 1511 1,000 mL at 05/04/23 1511    Current Facility-Administered Medications:    0.9 %  sodium chloride infusion, , Intravenous, Continuous, Mansouraty, Netty Starring., MD   lactated ringers infusion, , , Continuous PRN, Mansouraty, Netty Starring., MD, Last Rate: 10 mL/hr at 05/04/23 1511, 1,000 mL at 05/04/23 1511 Allergies  Allergen Reactions   Penicillins Hives and Rash    Did it involve swelling of the face/tongue/throat, SOB, or low BP? Yes Did it involve sudden or severe rash/hives, skin peeling, or any reaction on the inside of your mouth or nose? No  Did you need to seek medical attention at a hospital or doctor's office? Yes When did it last happen?     more than 10 years  If all above answers are "NO", may proceed with cephalosporin use.    Family History  Problem Relation Age of Onset   Heart disease Mother    Diabetes Father    Breast cancer Neg Hx    Social History   Socioeconomic History   Marital status: Single    Spouse name: Not on file   Number of children:  Not on file   Years of education: Not on file   Highest education level: Not on file  Occupational History   Not on file  Tobacco Use   Smoking status: Never   Smokeless tobacco: Never  Vaping Use   Vaping status: Never Used  Substance and Sexual Activity   Alcohol use: Not Currently    Alcohol/week: 1.0 standard drink of alcohol    Types: 1 Glasses of wine per week   Drug use: Never   Sexual activity: Not Currently  Other Topics Concern   Not on file  Social History Narrative   Not on file   Social Determinants of Health   Financial Resource Strain: Not on file  Food Insecurity: Not on file  Transportation Needs: Not on file  Physical Activity: Not on file  Stress: Not on file  Social Connections: Not on file  Intimate Partner Violence: Not on file    Physical Exam: Today's Vitals   05/04/23 1435  BP: (!) 193/87  Pulse: 67  Resp: 18  Temp: 97.7 F (36.5 C)  TempSrc: Tympanic  SpO2: 99%  Weight: 94.3 kg  Height: 5\' 1"  (1.549 m)  PainSc: 0-No pain   Body mass index is 39.28 kg/m. GEN: NAD EYE: Sclerae anicteric ENT: MMM CV: Non-tachycardic GI: Soft, NT/ND NEURO:  Alert & Oriented x 3  Lab  Results: No results for input(s): "WBC", "HGB", "HCT", "PLT" in the last 72 hours. BMET No results for input(s): "NA", "K", "CL", "CO2", "GLUCOSE", "BUN", "CREATININE", "CALCIUM" in the last 72 hours. LFT No results for input(s): "PROT", "ALBUMIN", "AST", "ALT", "ALKPHOS", "BILITOT", "BILIDIR", "IBILI" in the last 72 hours. PT/INR No results for input(s): "LABPROT", "INR" in the last 72 hours.   Impression / Plan: This is a 82 y.o.female who presents for Lower EUS to evaluate Rectal SEL and abnormal PET-CT.  The risks of an EUS including intestinal perforation, bleeding, infection, aspiration, and medication effects were discussed as was the possibility it may not give a definitive diagnosis if a biopsy is performed.   The risks and benefits of endoscopic  evaluation/treatment were discussed with the patient and/or family; these include but are not limited to the risk of perforation, infection, bleeding, missed lesions, lack of diagnosis, severe illness requiring hospitalization, as well as anesthesia and sedation related illnesses.  The patient's history has been reviewed, patient examined, no change in status, and deemed stable for procedure.  The patient and/or family is agreeable to proceed.    Corliss Parish, MD [facility] Office # [phone]

## 2023-05-04 NOTE — Transfer of Care (Signed)
Immediate Anesthesia Transfer of Care Note  Patient: Kirsten Horton  Procedure(s) Performed: LOWER ENDOSCOPIC ULTRASOUND (EUS) FLEXIBLE SIGMOIDOSCOPY FINE NEEDLE ASPIRATION (FNA) LINEAR  Patient Location: Endoscopy Unit  Anesthesia Type:MAC  Level of Consciousness: awake, alert , and oriented  Airway & Oxygen Therapy: Patient Spontanous Breathing  Post-op Assessment: Report given to RN and Post -op Vital signs reviewed and stable  Post vital signs: Reviewed and stable  Last Vitals:  Vitals Value Taken Time  BP 181/69 05/04/23 1656  Temp    Pulse 81 05/04/23 1658  Resp 16 05/04/23 1658  SpO2 97 % 05/04/23 1658  Vitals shown include unfiled device data.  Last Pain:  Vitals:   05/04/23 1435  TempSrc: Tympanic  PainSc: 0-No pain         Complications: No notable events documented.

## 2023-05-04 NOTE — Anesthesia Procedure Notes (Signed)
Date/Time: 05/04/2023 3:56 PM  Performed by: Florene Route, CRNAOxygen Delivery Method: Simple face mask

## 2023-05-04 NOTE — Anesthesia Preprocedure Evaluation (Addendum)
Anesthesia Evaluation  Patient identified by MRN, date of birth, ID band Patient awake    Reviewed: Allergy & Precautions, H&P , NPO status , Patient's Chart, lab work & pertinent test results  Airway Mallampati: II  TM Distance: >3 FB Neck ROM: Full    Dental no notable dental hx. (+) Teeth Intact, Dental Advisory Given   Pulmonary asthma    Pulmonary exam normal breath sounds clear to auscultation       Cardiovascular Exercise Tolerance: Good negative cardio ROS  Rhythm:Regular Rate:Normal     Neuro/Psych negative neurological ROS  negative psych ROS   GI/Hepatic negative GI ROS, Neg liver ROS,,,  Endo/Other    Morbid obesity  Renal/GU negative Renal ROS  negative genitourinary   Musculoskeletal  (+) Arthritis , Osteoarthritis,    Abdominal   Peds  Hematology negative hematology ROS (+)   Anesthesia Other Findings   Reproductive/Obstetrics negative OB ROS                             Anesthesia Physical Anesthesia Plan  ASA: 3  Anesthesia Plan: MAC   Post-op Pain Management: Minimal or no pain anticipated   Induction: Intravenous  PONV Risk Score and Plan: 2 and Propofol infusion and Treatment may vary due to age or medical condition  Airway Management Planned: Natural Airway and Simple Face Mask  Additional Equipment:   Intra-op Plan:   Post-operative Plan:   Informed Consent: I have reviewed the patients History and Physical, chart, labs and discussed the procedure including the risks, benefits and alternatives for the proposed anesthesia with the patient or authorized representative who has indicated his/her understanding and acceptance.     Dental advisory given  Plan Discussed with: CRNA and Surgeon  Anesthesia Plan Comments:        Anesthesia Quick Evaluation

## 2023-05-07 ENCOUNTER — Encounter (HOSPITAL_COMMUNITY): Payer: Self-pay | Admitting: Gastroenterology

## 2023-05-09 LAB — CYTOLOGY - NON PAP

## 2023-05-10 ENCOUNTER — Encounter: Payer: Self-pay | Admitting: Gastroenterology

## 2023-05-24 ENCOUNTER — Ambulatory Visit (HOSPITAL_COMMUNITY): Payer: Medicare PPO

## 2023-06-01 ENCOUNTER — Ambulatory Visit (HOSPITAL_COMMUNITY)
Admission: RE | Admit: 2023-06-01 | Discharge: 2023-06-01 | Disposition: A | Discharge status: Home / Self Care | Payer: Medicare PPO | Source: Ambulatory Visit | Attending: Pulmonary Disease | Admitting: Pulmonary Disease

## 2023-06-01 DIAGNOSIS — R911 Solitary pulmonary nodule: Secondary | ICD-10-CM | POA: Insufficient documentation

## 2023-06-01 DIAGNOSIS — I700 Atherosclerosis of aorta: Secondary | ICD-10-CM | POA: Diagnosis not present

## 2023-06-03 ENCOUNTER — Other Ambulatory Visit: Payer: Self-pay | Admitting: Internal Medicine

## 2023-06-03 ENCOUNTER — Ambulatory Visit
Admission: RE | Admit: 2023-06-03 | Discharge: 2023-06-03 | Disposition: A | Discharge status: Home / Self Care | Payer: Medicare PPO | Source: Ambulatory Visit | Attending: Internal Medicine | Admitting: Internal Medicine

## 2023-06-03 DIAGNOSIS — Z1231 Encounter for screening mammogram for malignant neoplasm of breast: Secondary | ICD-10-CM

## 2023-06-07 ENCOUNTER — Ambulatory Visit: Payer: Medicare PPO | Admitting: Plastic Surgery

## 2023-06-07 ENCOUNTER — Encounter: Payer: Self-pay | Admitting: Plastic Surgery

## 2023-06-07 VITALS — BP 182/78 | HR 73 | Ht 60.0 in | Wt 208.2 lb

## 2023-06-07 DIAGNOSIS — D489 Neoplasm of uncertain behavior, unspecified: Secondary | ICD-10-CM

## 2023-06-07 DIAGNOSIS — R220 Localized swelling, mass and lump, head: Secondary | ICD-10-CM

## 2023-06-07 NOTE — Progress Notes (Signed)
Referring Provider [name], [name], MD [address] [place],  [place] [address]   CC:  Chief Complaint  Patient presents with   Advice Only      Kirsten Horton is an 82 y.o. female.  HPI: Kirsten Horton is a very pleasant 82 year old female who presents today with a recurrent soft tissue mass superior and lateral to the upper orbital rim.  Patient states that she had this mass removed approximately 10 years ago and approximately 3 years after removal of the mass it began slowly growing again.  She states it is grown significantly over the past 2 to 3 years and her primary care provider is concerned about the mass.  She denies any pain or drainage from the mass.  Allergies  Allergen Reactions   Penicillins Hives and Rash    Did it involve swelling of the face/tongue/throat, SOB, or low BP? Yes Did it involve sudden or severe rash/hives, skin peeling, or any reaction on the inside of your mouth or nose? No  Did you need to seek medical attention at a hospital or doctor's office? Yes When did it last happen?     more than 10 years  If all above answers are "NO", may proceed with cephalosporin use.     Outpatient Encounter Medications as of 06/07/2023  Medication Sig Note   acetaminophen (TYLENOL) 500 MG tablet Take 1,000 mg by mouth daily as needed for moderate pain.    albuterol (PROVENTIL HFA;VENTOLIN HFA) 108 (90 BASE) MCG/ACT inhaler Inhale 2 puffs into the lungs every 6 (six) hours as needed. For shortness of breath    CALCIUM PO Take by mouth.    Cyanocobalamin (VITAMIN B 12 PO) Take 1 tablet by mouth daily.    diclofenac sodium (VOLTAREN) 1 % GEL Apply 2 g topically 4 (four) times daily as needed. For knee pain    Fluticasone-Salmeterol (ADVAIR) 250-50 MCG/DOSE AEPB Inhale 1 puff into the lungs 2 (two) times daily.    latanoprost (XALATAN) 0.005 % ophthalmic solution Place 1 drop into both eyes at bedtime. 04/23/2015: .    LIPITOR 80 MG tablet     VITAMIN D PO  Take by mouth.    No facility-administered encounter medications on file as of 06/07/2023.     Past Medical History:  Diagnosis Date   Arthritis    Asthma    seasonal   Pneumonia     Past Surgical History:  Procedure Laterality Date   ABDOMINAL HYSTERECTOMY     COLONOSCOPY     EUS N/A 05/04/2023   Procedure: LOWER ENDOSCOPIC ULTRASOUND (EUS);  Surgeon: Lemar Lofty., MD;  Location: [facility];  Service: Gastroenterology;  Laterality: N/A;   FINE NEEDLE ASPIRATION N/A 05/04/2023   Procedure: FINE NEEDLE ASPIRATION (FNA) LINEAR;  Surgeon: Lemar Lofty., MD;  Location: [facility];  Service: Gastroenterology;  Laterality: N/A;   FLEXIBLE SIGMOIDOSCOPY N/A 05/04/2023   Procedure: FLEXIBLE SIGMOIDOSCOPY;  Surgeon: Meridee Score Netty Starring., MD;  Location: [facility];  Service: Gastroenterology;  Laterality: N/A;   FOOT ARTHROPLASTY Right    TOTAL KNEE ARTHROPLASTY  11/30/2011   Procedure: TOTAL KNEE ARTHROPLASTY;  Surgeon: Loreta Ave, MD;  Location: [facility] OR;  Service: Orthopedics;  Laterality: Left;  left total knee arthroplasty    Family History  Problem Relation Age of Onset   Heart disease Mother    Diabetes Father    Breast cancer Neg Hx     Social History   Social History Narrative  Not on file     Review of Systems General: Denies fevers, chills, weight loss CV: Denies chest pain, shortness of breath, palpitations Skin: A 1 cm x 1 cm soft tissue mass over the right I which does not cause any pain.  Physical Exam    06/07/2023    9:27 AM 05/04/2023    5:40 PM 05/04/2023    5:30 PM  Vitals with BMI  Height 5\' 0"     Weight 208 lbs 3 oz    BMI 40.66    Systolic 182 198 109  Diastolic 78 88 84  Pulse 73 58 53    General:  No acute distress,  Alert and oriented, Non-Toxic, Normal speech and affect Skin: There is a soft mobile 1 x 1 cm mass over the left lateral portion of the eye.  It is not tender to palpation. Mammogram: October 2023  BI-RADS 1 Assessment/Plan Soft tissue mass, supraorbital rim right side: This is most consistent with a current lipoma.  I discussed the procedure with the patient including doing this in the office under local anesthetic if she was agreeable.  She was.  We discussed the risks of scar formation which she will have as well as recurrence of the mass which is always possible.  We also discussed the possible risk of bleeding and infection.  Patient understood and all questions were answered to her satisfaction.  Will schedule her for surgical removal of the soft tissue mass at her request.  Photographs were obtained today with her consent.  Santiago Glad 06/07/2023, 11:25 AM

## 2023-06-08 ENCOUNTER — Ambulatory Visit
Admission: RE | Admit: 2023-06-08 | Discharge: 2023-06-08 | Disposition: A | Discharge status: Home / Self Care | Payer: Medicare PPO | Source: Ambulatory Visit | Attending: Internal Medicine | Admitting: Internal Medicine

## 2023-06-08 DIAGNOSIS — Z1231 Encounter for screening mammogram for malignant neoplasm of breast: Secondary | ICD-10-CM | POA: Diagnosis not present

## 2023-07-10 ENCOUNTER — Ambulatory Visit: Payer: Medicare PPO | Admitting: Plastic Surgery

## 2023-07-10 VITALS — BP 107/76 | HR 82

## 2023-07-10 DIAGNOSIS — D489 Neoplasm of uncertain behavior, unspecified: Secondary | ICD-10-CM

## 2023-07-10 DIAGNOSIS — D170 Benign lipomatous neoplasm of skin and subcutaneous tissue of head, face and neck: Secondary | ICD-10-CM | POA: Diagnosis not present

## 2023-07-10 NOTE — Progress Notes (Signed)
Procedure Note  Preoperative Dx: Right forehead mass  Postoperative Dx: Same  Procedure: Excision of right forehead mass  Anesthesia: Lidocaine 1% with 1:100,000 epinephrine and 0.25% Sensorcaine   Indication for Procedure: Removal for pathologic diagnosis  Description of Procedure: Risks and complications were explained to the patient including the possibility of recurrence.  Consent was confirmed and the patient understands the risks and benefits.  The potential complications and alternatives were explained and the patient consents.  The patient expressed understanding the option of not having the procedure and the risks of a scar.  Time out was called and all information was confirmed to be correct.    The area was prepped and drapped.  Local anesthetic was injected in the subcutaneous tissues.  After waiting for the local to take affect a 2 cm transverse incision was made across the mass and a combination of sharp and blunt dissection were used to remove the mass which was approximately 2 x 2 cm..  After obtaining hemostasis, the surgical wound was closed with interrupted 4-0 Monocryl sutures in the muscle and dermis and 5-0 Prolene sutures and the skin.  The surgical wound measured 2 cm.  A dressing was applied.  The patient was given instructions on how to care for the area and a follow up appointment.  Kirsten Horton tolerated the procedure well and there were no complications. The specimen was sent to pathology.

## 2023-07-12 DIAGNOSIS — H35033 Hypertensive retinopathy, bilateral: Secondary | ICD-10-CM | POA: Diagnosis not present

## 2023-07-12 DIAGNOSIS — H59812 Chorioretinal scars after surgery for detachment, left eye: Secondary | ICD-10-CM | POA: Diagnosis not present

## 2023-07-12 DIAGNOSIS — H35371 Puckering of macula, right eye: Secondary | ICD-10-CM | POA: Diagnosis not present

## 2023-07-12 LAB — DERMATOLOGY PATHOLOGY

## 2023-07-17 ENCOUNTER — Ambulatory Visit: Payer: Medicare PPO | Admitting: Surgical

## 2023-07-17 VITALS — BP 117/73 | HR 91

## 2023-07-17 DIAGNOSIS — D489 Neoplasm of uncertain behavior, unspecified: Secondary | ICD-10-CM

## 2023-07-17 DIAGNOSIS — Z9889 Other specified postprocedural states: Secondary | ICD-10-CM

## 2023-07-17 NOTE — Progress Notes (Signed)
82 year old female here for follow-up after excision of right forehead mass with Dr. Ladona Ridgel 7 days ago.    Pathology reviewed: lipoma  Patient is doing well, she is not having any issues.  She reports the sutures are beginning to become itchy.  On exam right forehead incision is intact, healing well.  Prolene sutures are in place.  No erythema or cellulitic changes noted.  She does have some residual swelling, as expected.  No subcutaneous fluid collections noted. She has normal movement of her right eyebrow and forehead.   A/P:  Discussed the importance of sunscreen, scar cream, light massage. Sutures were removed.  Patient tolerated this well.  Recommend following up as needed

## 2023-08-08 DIAGNOSIS — H401134 Primary open-angle glaucoma, bilateral, indeterminate stage: Secondary | ICD-10-CM | POA: Diagnosis not present

## 2023-10-25 DIAGNOSIS — I10 Essential (primary) hypertension: Secondary | ICD-10-CM | POA: Diagnosis not present

## 2023-10-25 DIAGNOSIS — R7302 Impaired glucose tolerance (oral): Secondary | ICD-10-CM | POA: Diagnosis not present

## 2023-10-25 DIAGNOSIS — E7800 Pure hypercholesterolemia, unspecified: Secondary | ICD-10-CM | POA: Diagnosis not present

## 2023-10-25 DIAGNOSIS — G63 Polyneuropathy in diseases classified elsewhere: Secondary | ICD-10-CM | POA: Diagnosis not present

## 2023-10-25 DIAGNOSIS — D692 Other nonthrombocytopenic purpura: Secondary | ICD-10-CM | POA: Diagnosis not present

## 2023-10-25 DIAGNOSIS — D125 Benign neoplasm of sigmoid colon: Secondary | ICD-10-CM | POA: Diagnosis not present

## 2023-10-25 DIAGNOSIS — E559 Vitamin D deficiency, unspecified: Secondary | ICD-10-CM | POA: Diagnosis not present

## 2023-10-25 DIAGNOSIS — H4010X0 Unspecified open-angle glaucoma, stage unspecified: Secondary | ICD-10-CM | POA: Diagnosis not present

## 2023-11-01 ENCOUNTER — Encounter: Payer: Self-pay | Admitting: Pulmonary Disease

## 2023-11-01 ENCOUNTER — Ambulatory Visit (INDEPENDENT_AMBULATORY_CARE_PROVIDER_SITE_OTHER): Payer: Medicare PPO | Admitting: Pulmonary Disease

## 2023-11-01 VITALS — BP 128/84 | HR 71 | Ht 60.0 in | Wt 213.2 lb

## 2023-11-01 DIAGNOSIS — J4541 Moderate persistent asthma with (acute) exacerbation: Secondary | ICD-10-CM | POA: Diagnosis not present

## 2023-11-01 DIAGNOSIS — R911 Solitary pulmonary nodule: Secondary | ICD-10-CM | POA: Diagnosis not present

## 2023-11-01 MED ORDER — FLUTICASONE-SALMETEROL 250-50 MCG/ACT IN AEPB: 1.0000 | INHALATION_SPRAY | Freq: Two times a day (BID) | RESPIRATORY_TRACT | 11 refills | Status: AC

## 2023-11-01 MED ORDER — ALBUTEROL SULFATE (2.5 MG/3ML) 0.083% IN NEBU
2.5000 mg | INHALATION_SOLUTION | Freq: Once | RESPIRATORY_TRACT | Status: DC
Start: 2023-11-01 — End: 2023-11-01

## 2023-11-01 MED ORDER — METHYLPREDNISOLONE ACETATE 80 MG/ML IJ SUSP
40.0000 mg | Freq: Once | INTRAMUSCULAR | Status: AC
Start: 2023-11-01 — End: 2023-11-01
  Administered 2023-11-01: 40 mg via INTRAMUSCULAR

## 2023-11-01 MED ORDER — IPRATROPIUM-ALBUTEROL 0.5-2.5 (3) MG/3ML IN SOLN
3.0000 mL | Freq: Once | RESPIRATORY_TRACT | Status: AC
Start: 2023-11-01 — End: 2023-11-01
  Administered 2023-11-01: 3 mL via RESPIRATORY_TRACT

## 2023-11-01 MED ORDER — ALBUTEROL SULFATE HFA 108 (90 BASE) MCG/ACT IN AERS: 1.0000 | INHALATION_SPRAY | RESPIRATORY_TRACT | 11 refills | Status: AC | PRN

## 2023-11-01 MED ORDER — PREDNISONE 10 MG PO TABS
ORAL_TABLET | ORAL | 0 refills | Status: AC
Start: 2023-11-01 — End: 2023-11-13

## 2023-11-01 MED ORDER — MONTELUKAST SODIUM 10 MG PO TABS
10.0000 mg | ORAL_TABLET | Freq: Every day | ORAL | 11 refills | Status: DC
Start: 2023-11-01 — End: 2024-05-29

## 2023-11-01 NOTE — Patient Instructions (Addendum)
 We will give you a duoneb nebulizer treatment and steroid injection today  Start steroid taper tomorrow: 40mg  daily x 3 days 30mg  daily x 3 days 20mg  daily x 3 days 10mg  daily x 3 days  Continue symbicort inhaler 1 puff twice daily - rinse mouth out after each use  Start montelukast 10mg  daily at bedtime  Use albuterol inhaler 1-2 puffs every 4-6 hours as needed  We will schedule you for follow up CT Chest for the lung nodule in October  Follow up in late October or early November to review CT Chest scan

## 2023-11-01 NOTE — Progress Notes (Signed)
 Synopsis: Referred in February 2024 for pulmonary nodule by Thurmon Fair, MD  Subjective:   PATIENT ID: Kirsten Horton GENDER: female DOB: May 07, 1941, MRN: [id number]  HPI  Chief Complaint  Patient presents with   Follow-up   Kirsten Horton is an 83 year old woman, never smoker with hyperlipidemia, asthma and lung nodule who returns to pulmonary clinic for follow up.  She is experiencing an exacerbation of asthma symptoms, primarily due to seasonal allergies. Symptoms include hoarseness, congestion, a sensation of something in her throat, shortness of breath, and wheezing. She has been waking up nightly over the past week due to cough or wheezing, necessitating the use of her inhalers.  The symptoms began last week, coinciding with environmental factors such as smoke in the area and street paving with oil near her home.   She continues to use her Advair inhaler, one puff in the morning and one in the evening, and has increased her use of the albuterol inhaler to three to four times a day recently. She is not currently taking any over-the-counter allergy medications but recalls using Singulair (montelukast) years ago without issues when she first experienced similar symptoms.  Lung nodule is stable on repeat scan in 05/2023.  OV 11/07/22 NM PET scan shows 1.2cm LLL nodule with low tracer uptake SUV max of 1.36. PET scan also shows 1.8cm rectal lesion with SUV max of 8.   She denies any bloody stools, pain with defecation or abdominal pain/bloating.   PFTs show mild restrictive defect.   Initial OV 09/30/22 She had CT Cardiac Scoring scan on 09/16/22 which showed 1.7 x 2 cm nodule that has enlarged since 2016 from 7mm.   She has history of asthma and is using advair 250-63mcg 1 puff twice daily and as needed albuterol. She denies night time awakenings.   She has trouble with her swallowing since last July after she had covid. She feels like food/liquids get stuck in her  throat. She has hoarse voice which has been there for years. She denies any weight loss, sweats or fevers.  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 She is a never smoker but has significant second hand smoke exposure in childhood and from her husband.   Past Medical History:  Diagnosis Date   Arthritis    Asthma    seasonal   Pneumonia      Family History  Problem Relation Age of Onset   Heart disease Mother    Diabetes Father    Breast cancer Neg Hx      Social History   Socioeconomic History   Marital status: Single    Spouse name: Not on file   Number of children: Not on file   Years of education: Not on file   Highest education level: Not on file  Occupational History   Not on file  Tobacco Use   Smoking status: Never   Smokeless tobacco: Never  Vaping Use   Vaping status: Never Used  Substance and Sexual Activity   Alcohol use: Not Currently    Alcohol/week: 1.0 standard drink of alcohol    Types: 1 Glasses of wine per week   Drug use: Never   Sexual activity: Not Currently  Other Topics Concern   Not on file  Social History Narrative   Not on file   Social Drivers of Health   Financial Resource Strain: Not on file  Food Insecurity: Not on file  Transportation Needs: Not on file  Physical Activity: Not on file  Stress: Not on file  Social Connections: Not on file  Intimate Partner Violence: Not on file     Allergies  Allergen Reactions   Penicillin V Potassium Rash   Penicillins Hives and Rash    Did it involve swelling of the face/tongue/throat, SOB, or low BP? Yes Did it involve sudden or severe rash/hives, skin peeling, or any reaction on the inside of your mouth or nose? No  Did you need to seek medical attention at a hospital or doctor's office? Yes When did it last happen?     more than 10 years  If all above answers are "NO", may proceed with cephalosporin use.      Outpatient Medications Prior to  Visit  Medication Sig Dispense Refill   acetaminophen (TYLENOL) 500 MG tablet Take 1,000 mg by mouth daily as needed for moderate pain.     CALCIUM PO Take by mouth.     Cyanocobalamin (VITAMIN B 12 PO) Take 1 tablet by mouth daily.     diclofenac sodium (VOLTAREN) 1 % GEL Apply 2 g topically 4 (four) times daily as needed. For knee pain     latanoprost (XALATAN) 0.005 % ophthalmic solution Place 1 drop into both eyes at bedtime.  11   LIPITOR 80 MG tablet      VITAMIN D PO Take by mouth.     albuterol (PROVENTIL HFA;VENTOLIN HFA) 108 (90 BASE) MCG/ACT inhaler Inhale 2 puffs into the lungs every 6 (six) hours as needed. For shortness of breath     Fluticasone-Salmeterol (ADVAIR) 250-50 MCG/DOSE AEPB Inhale 1 puff into the lungs 2 (two) times daily.     No facility-administered medications prior to visit.   Review of Systems  Constitutional:  Negative for chills, fever, malaise/fatigue and weight loss.  HENT:  Negative for congestion, sinus pain and sore throat.   Eyes: Negative.   Respiratory:  Positive for cough, shortness of breath and wheezing. Negative for hemoptysis and sputum production.   Cardiovascular:  Negative for chest pain, palpitations, orthopnea, claudication and leg swelling.  Gastrointestinal:  Negative for abdominal pain, heartburn, nausea and vomiting.  Genitourinary: Negative.   Musculoskeletal:  Negative for joint pain and myalgias.  Skin:  Negative for rash.  Neurological:  Negative for weakness.  Endo/Heme/Allergies:  Positive for environmental allergies.  Psychiatric/Behavioral: Negative.     Objective:   Vitals:   11/01/23 0831  BP: 128/84  Pulse: 71  SpO2: 99%  Weight: 213 lb 3.2 oz (96.7 kg)  Height: 5' (1.524 m)    Physical Exam Constitutional:      General: She is not in acute distress.    Appearance: She is obese. She is not ill-appearing.  HENT:     Head: Normocephalic and atraumatic.  Eyes:     General: No scleral icterus.     Conjunctiva/sclera: Conjunctivae normal.  Cardiovascular:     Rate and Rhythm: Normal rate and regular rhythm.     Pulses: Normal pulses.     Heart sounds: Normal heart sounds. No murmur heard. Pulmonary:     Effort: Pulmonary effort is normal.     Breath sounds: Wheezing (diffuse) present. No rhonchi or rales.  Musculoskeletal:     Right lower leg: No edema.     Left lower leg: No edema.  Skin:    General: Skin is warm and dry.  Neurological:     General: No focal deficit present.  Mental Status: She is alert.     CBC    Component Value Date/Time   WBC 8.3 01/31/2023 0918   RBC 3.69 (L) 01/31/2023 0918   HGB 11.8 (L) 01/31/2023 0918   HCT 36.0 01/31/2023 0918   PLT 256.0 01/31/2023 0918   MCV 97.6 01/31/2023 0918   MCH 30.7 10/20/2018 1136   MCHC 32.7 01/31/2023 0918   RDW 13.6 01/31/2023 0918   LYMPHSABS 1.8 10/20/2018 1136   MONOABS 1.1 (H) 10/20/2018 1136   EOSABS 0.0 10/20/2018 1136   BASOSABS 0.0 10/20/2018 1136      Latest Ref Rng & Units 01/31/2023    9:18 AM 10/18/2018    6:50 AM 10/17/2018   10:04 AM  BMP  Glucose 70 - 99 mg/dL 85  045    BUN 6 - 23 mg/dL 8  19    Creatinine 4.09 - 1.20 mg/dL 8.11  9.14  7.82   Sodium 135 - 145 mEq/L 140  136    Potassium 3.5 - 5.1 mEq/L 3.8  4.5    Chloride 96 - 112 mEq/L 104  98    CO2 19 - 32 mEq/L 28  29    Calcium 8.4 - 10.5 mg/dL 8.8  8.7     Chest imaging: CT Chest 06/01/23 Mediastinum/Nodes: No axillary or supraclavicular adenopathy. No mediastinal or hilar adenopathy. No pericardial fluid. Esophagus normal.   Lungs/Pleura: A persistent nodule in the LEFT lower lobe measures 9 mm x 9 mm not changed from 12 mm x 9 mm on prior remeasured. This nodule was minimally hypermetabolic comparison FDG PET scan. No additional pulmonary nodules.  NM PET scan 10/19/22 1. There is a 1.2 cm nodule within the left lower lobe which has an SUV max of 1.36. On the remote CT from 04/24/2015 this measured 8 mm. Low tracer  uptake favors a benign etiology such as a pulmonary hamartoma. Low-grade pulmonary neoplasm such as adenocarcinoma is not excluded. If biopsy is deferred thin close interval surveillance is advised to exclude the possibility of a slow growing pulmonary adenocarcinoma. 2. Focal area of abnormal increased uptake is identified along the anterior wall of the distal rectum. A corresponding focal area of soft tissue measures 1.8 cm on the CT images. Correlation with colon cancer screening is advised. 3. Coronary artery calcifications. 4.  Aortic Atherosclerosis  CT Cardiac Scoring Scan 09/16/22 1. 1.7 x 1.2 cm nodule in the periphery of the left lower lobe, increased in size compared to prior CT of the abdomen and pelvis 04/24/2015, concerning for neoplasm. Outpatient referral to Pulmonology for further clinical evaluation and consideration for biopsy and/or PET-CT is strongly recommended in the near future. 2. Aortic atherosclerosis.  PFT:    Latest Ref Rng & Units 10/05/2022   11:01 AM  PFT Results  FVC-Pre L 1.24   FVC-Predicted Pre % 63   FVC-Post L 1.51   FVC-Predicted Post % 77   Pre FEV1/FVC % % 86   Post FEV1/FCV % % 81   FEV1-Pre L 1.06   FEV1-Predicted Pre % 74   FEV1-Post L 1.23   DLCO uncorrected ml/min/mmHg 13.11   DLCO UNC% % 83   DLCO corrected ml/min/mmHg 13.11   DLCO COR %Predicted % 83   DLVA Predicted % 120   TLC L 3.05   TLC % Predicted % 70   RV % Predicted % 75     Labs:  Path:  Echo:  Heart Catheterization:  Assessment & Plan:   Moderate persistent  asthma with acute exacerbation - Plan: predniSONE (DELTASONE) 10 MG tablet, albuterol (VENTOLIN HFA) 108 (90 Base) MCG/ACT inhaler, fluticasone-salmeterol (ADVAIR DISKUS) 250-50 MCG/ACT AEPB, montelukast (SINGULAIR) 10 MG tablet, methylPREDNISolone acetate (DEPO-MEDROL) injection 40 mg, albuterol (PROVENTIL) (2.5 MG/3ML) 0.083% nebulizer solution 2.5 mg  Pulmonary nodule 1 cm or greater in diameter -  Plan: CT Chest Wo Contrast  Discussion: Kirsten Horton is an 83 year old woman, never smoker with hyperlipidemia and asthma who returns to pulmonary clinic for lung nodule.    Asthma exacerbation Exacerbation likely due to seasonal allergies and environmental factors. Increased albuterol use noted.  - Prescribe 12-day prednisone taper: 40 mg daily for 3 days, then 30 mg for 3 days, 20 mg for 3 days, 10 mg for 3 days. - Administer 40 mg steroid injection today. - Provide duoneb breathing treatment during visit. - Update albuterol prescription. - Instruct to contact office if symptoms persist despite steroids and montelukast.  Seasonal allergies Asthma exacerbation likely exacerbated by seasonal allergies. Montelukast recommended for management during peak allergy months. - Prescribe montelukast for 2-3 months during allergy season. - Advise starting montelukast in late February or early March in future years.  Pulmonary nodule Pulmonary nodule stable with no changes on recent CT scan. Continued monitoring planned. - Order follow-up CT scan in October 2025.  Follow up in late October or early November after CT Chest scan  Melody Comas, MD [facility] Office: [phone]    Current Outpatient Medications:    acetaminophen (TYLENOL) 500 MG tablet, Take 1,000 mg by mouth daily as needed for moderate pain., Disp: , Rfl:    CALCIUM PO, Take by mouth., Disp: , Rfl:    Cyanocobalamin (VITAMIN B 12 PO), Take 1 tablet by mouth daily., Disp: , Rfl:    diclofenac sodium (VOLTAREN) 1 % GEL, Apply 2 g topically 4 (four) times daily as needed. For knee pain, Disp: , Rfl:    fluticasone-salmeterol (ADVAIR DISKUS) 250-50 MCG/ACT AEPB, Inhale 1 puff into the lungs in the morning and at bedtime., Disp: 60 each, Rfl: 11   latanoprost (XALATAN) 0.005 % ophthalmic solution, Place 1 drop into both eyes at bedtime., Disp: , Rfl: 11   LIPITOR 80 MG tablet, , Disp: , Rfl:     montelukast (SINGULAIR) 10 MG tablet, Take 1 tablet (10 mg total) by mouth at bedtime., Disp: 30 tablet, Rfl: 11   predniSONE (DELTASONE) 10 MG tablet, Take 4 tablets (40 mg total) by mouth daily with breakfast for 3 days, THEN 3 tablets (30 mg total) daily with breakfast for 3 days, THEN 2 tablets (20 mg total) daily with breakfast for 3 days, THEN 1 tablet (10 mg total) daily with breakfast for 3 days., Disp: 30 tablet, Rfl: 0   VITAMIN D PO, Take by mouth., Disp: , Rfl:    albuterol (VENTOLIN HFA) 108 (90 Base) MCG/ACT inhaler, Inhale 1-2 puffs into the lungs every 4 (four) hours as needed. For shortness of breath, Disp: 8 g, Rfl: 11  Current Facility-Administered Medications:    albuterol (PROVENTIL) (2.5 MG/3ML) 0.083% nebulizer solution 2.5 mg, 2.5 mg, Nebulization, Once, Melody Comas B, MD   methylPREDNISolone acetate (DEPO-MEDROL) injection 40 mg, 40 mg, Intramuscular, Once, Francine Graven Bettina Gavia, MD

## 2023-12-07 DIAGNOSIS — H26491 Other secondary cataract, right eye: Secondary | ICD-10-CM | POA: Diagnosis not present

## 2023-12-07 DIAGNOSIS — H401134 Primary open-angle glaucoma, bilateral, indeterminate stage: Secondary | ICD-10-CM | POA: Diagnosis not present

## 2024-02-21 ENCOUNTER — Ambulatory Visit: Admitting: Podiatry

## 2024-02-21 DIAGNOSIS — M7752 Other enthesopathy of left foot: Secondary | ICD-10-CM

## 2024-02-21 DIAGNOSIS — M778 Other enthesopathies, not elsewhere classified: Secondary | ICD-10-CM

## 2024-02-21 NOTE — Progress Notes (Signed)
 Subjective:  Patient ID: Kirsten Horton, female    DOB: [birth year],  MRN: [id number]  Chief Complaint  Patient presents with   Foot Pain    Pt stated that she has arthritis and is having some pain in her left foot     83 y.o. female presents with the above complaint.  Patient presents with pain to the left dorsal foot pain with ambulation.  She states been on for quite some time is progressive, worse worse with ambulation is with pressure.  She would like to discuss treatment options for this pain scale 7 out of 10 dull achy in nature hurts with ambulation worse with pressure   Review of Systems: Negative except as noted in the HPI. Denies N/V/F/Ch.  Past Medical History:  Diagnosis Date   Arthritis    Asthma    seasonal   Pneumonia     Current Outpatient Medications:    acetaminophen  (TYLENOL ) 500 MG tablet, Take 1,000 mg by mouth daily as needed for moderate pain., Disp: , Rfl:    albuterol  (VENTOLIN  HFA) 108 (90 Base) MCG/ACT inhaler, Inhale 1-2 puffs into the lungs every 4 (four) hours as needed. For shortness of breath, Disp: 8 g, Rfl: 11   CALCIUM  PO, Take by mouth., Disp: , Rfl:    Cyanocobalamin  (VITAMIN B 12 PO), Take 1 tablet by mouth daily., Disp: , Rfl:    diclofenac  sodium (VOLTAREN ) 1 % GEL, Apply 2 g topically 4 (four) times daily as needed. For knee pain, Disp: , Rfl:    fluticasone -salmeterol (ADVAIR  DISKUS) 250-50 MCG/ACT AEPB, Inhale 1 puff into the lungs in the morning and at bedtime., Disp: 60 each, Rfl: 11   latanoprost  (XALATAN ) 0.005 % ophthalmic solution, Place 1 drop into both eyes at bedtime., Disp: , Rfl: 11   LIPITOR 80 MG tablet, , Disp: , Rfl:    montelukast  (SINGULAIR ) 10 MG tablet, Take 1 tablet (10 mg total) by mouth at bedtime., Disp: 30 tablet, Rfl: 11   VITAMIN D PO, Take by mouth., Disp: , Rfl:   Social History   Tobacco Use  Smoking Status Never  Smokeless Tobacco Never    Allergies  Allergen Reactions   Penicillin V  Potassium Rash   Penicillins Hives and Rash    Did it involve swelling of the face/tongue/throat, SOB, or low BP? Yes Did it involve sudden or severe rash/hives, skin peeling, or any reaction on the inside of your mouth or nose? No  Did you need to seek medical attention at a hospital or doctor's office? Yes When did it last happen?     more than 10 years  If all above answers are NO, may proceed with cephalosporin use.    Objective:  There were no vitals filed for this visit. There is no height or weight on file to calculate BMI. Constitutional Well developed. Well nourished.  Vascular Dorsalis pedis pulses palpable bilaterally. Posterior tibial pulses palpable bilaterally. Capillary refill normal to all digits.  No cyanosis or clubbing noted. Pedal hair growth normal.  Neurologic Normal speech. Oriented to person, place, and time. Epicritic sensation to light touch grossly present bilaterally.  Dermatologic Nails well groomed and normal in appearance. No open wounds. No skin lesions.  Orthopedic: Pain on palpation left extensor tendinitis pain along the course of the extensor tendon pain with resisted dorsiflexion of the digits.  No pain with plantarflexion of the digits resisted   Radiographs: None Assessment:   1. Extensor tendinitis of foot  Plan:  Patient was evaluated and treated and all questions answered.  Left foot extensor tendinitis - All signs and concerns were discussed with the patient in extensive detail - Given the amount of pain that she is having should benefit from cam boot immobilization or allow the soft tissue structure to heal appropriately she states understanding - If there is no improvement we will discuss steroid injection during next visit  No follow-ups on file.

## 2024-03-20 ENCOUNTER — Ambulatory Visit

## 2024-03-20 ENCOUNTER — Ambulatory Visit (INDEPENDENT_AMBULATORY_CARE_PROVIDER_SITE_OTHER): Admitting: Podiatry

## 2024-03-20 DIAGNOSIS — M7751 Other enthesopathy of right foot: Secondary | ICD-10-CM

## 2024-03-20 DIAGNOSIS — M19072 Primary osteoarthritis, left ankle and foot: Secondary | ICD-10-CM | POA: Diagnosis not present

## 2024-03-20 DIAGNOSIS — Z6841 Body Mass Index (BMI) 40.0 and over, adult: Secondary | ICD-10-CM | POA: Insufficient documentation

## 2024-03-20 DIAGNOSIS — R911 Solitary pulmonary nodule: Secondary | ICD-10-CM | POA: Insufficient documentation

## 2024-03-20 DIAGNOSIS — Z0000 Encounter for general adult medical examination without abnormal findings: Secondary | ICD-10-CM | POA: Insufficient documentation

## 2024-03-20 DIAGNOSIS — D170 Benign lipomatous neoplasm of skin and subcutaneous tissue of head, face and neck: Secondary | ICD-10-CM

## 2024-03-20 DIAGNOSIS — E559 Vitamin D deficiency, unspecified: Secondary | ICD-10-CM

## 2024-03-20 DIAGNOSIS — R339 Retention of urine, unspecified: Secondary | ICD-10-CM | POA: Insufficient documentation

## 2024-03-20 DIAGNOSIS — Z01818 Encounter for other preprocedural examination: Secondary | ICD-10-CM

## 2024-03-20 DIAGNOSIS — C49A0 Gastrointestinal stromal tumor, unspecified site: Secondary | ICD-10-CM

## 2024-03-20 DIAGNOSIS — M778 Other enthesopathies, not elsewhere classified: Secondary | ICD-10-CM | POA: Diagnosis not present

## 2024-03-20 DIAGNOSIS — D125 Benign neoplasm of sigmoid colon: Secondary | ICD-10-CM | POA: Insufficient documentation

## 2024-03-20 DIAGNOSIS — R002 Palpitations: Secondary | ICD-10-CM | POA: Insufficient documentation

## 2024-03-20 DIAGNOSIS — E113299 Type 2 diabetes mellitus with mild nonproliferative diabetic retinopathy without macular edema, unspecified eye: Secondary | ICD-10-CM

## 2024-03-20 HISTORY — DX: Retention of urine, unspecified: R33.9

## 2024-03-20 HISTORY — DX: Palpitations: R00.2

## 2024-03-20 HISTORY — DX: Solitary pulmonary nodule: R91.1

## 2024-03-20 HISTORY — DX: Benign lipomatous neoplasm of skin and subcutaneous tissue of head, face and neck: D17.0

## 2024-03-20 HISTORY — DX: Benign neoplasm of sigmoid colon: D12.5

## 2024-03-20 HISTORY — DX: Vitamin D deficiency, unspecified: E55.9

## 2024-03-20 HISTORY — DX: Type 2 diabetes mellitus with mild nonproliferative diabetic retinopathy without macular edema, unspecified eye: E11.3299

## 2024-03-20 HISTORY — DX: Gastrointestinal stromal tumor, unspecified site: C49.A0

## 2024-03-20 NOTE — Progress Notes (Unsigned)
 Subjective:  Patient ID: Kirsten Horton, female    DOB: [birth year],  MRN: [id number]  Chief Complaint  Patient presents with   Extensor tendinitis of foot    Pt stated that she is doing okay she does have some slight discomfort     83 y.o. female presents with the above complaint.  Patient presents with pain to the left dorsal foot pain with ambulation.  She states been on for quite some time is progressive, worse worse with ambulation is with pressure.  She would like to discuss treatment options for this pain scale 7 out of 10 dull achy in nature hurts with ambulation worse with pressure   Review of Systems: Negative except as noted in the HPI. Denies N/V/F/Ch.  Past Medical History:  Diagnosis Date   Arthritis    Asthma    seasonal   Pneumonia     Current Outpatient Medications:    acetaminophen  (TYLENOL ) 500 MG tablet, Take 1,000 mg by mouth daily as needed for moderate pain., Disp: , Rfl:    albuterol  (VENTOLIN  HFA) 108 (90 Base) MCG/ACT inhaler, Inhale 1-2 puffs into the lungs every 4 (four) hours as needed. For shortness of breath, Disp: 8 g, Rfl: 11   CALCIUM  PO, Take by mouth., Disp: , Rfl:    Cyanocobalamin  (VITAMIN B 12 PO), Take 1 tablet by mouth daily., Disp: , Rfl:    diclofenac  sodium (VOLTAREN ) 1 % GEL, Apply 2 g topically 4 (four) times daily as needed. For knee pain, Disp: , Rfl:    fluticasone -salmeterol (ADVAIR  DISKUS) 250-50 MCG/ACT AEPB, Inhale 1 puff into the lungs in the morning and at bedtime., Disp: 60 each, Rfl: 11   latanoprost  (XALATAN ) 0.005 % ophthalmic solution, Place 1 drop into both eyes at bedtime., Disp: , Rfl: 11   LIPITOR 80 MG tablet, , Disp: , Rfl:    montelukast  (SINGULAIR ) 10 MG tablet, Take 1 tablet (10 mg total) by mouth at bedtime., Disp: 30 tablet, Rfl: 11   VITAMIN D PO, Take by mouth., Disp: , Rfl:   Social History   Tobacco Use  Smoking Status Never  Smokeless Tobacco Never    Allergies  Allergen Reactions    Penicillin V Potassium Rash   Penicillins Hives and Rash    Did it involve swelling of the face/tongue/throat, SOB, or low BP? Yes Did it involve sudden or severe rash/hives, skin peeling, or any reaction on the inside of your mouth or nose? No  Did you need to seek medical attention at a hospital or doctor's office? Yes When did it last happen?     more than 10 years  If all above answers are NO, may proceed with cephalosporin use.    Objective:  There were no vitals filed for this visit. There is no height or weight on file to calculate BMI. Constitutional Well developed. Well nourished.  Vascular Dorsalis pedis pulses palpable bilaterally. Posterior tibial pulses palpable bilaterally. Capillary refill normal to all digits.  No cyanosis or clubbing noted. Pedal hair growth normal.  Neurologic Normal speech. Oriented to person, place, and time. Epicritic sensation to light touch grossly present bilaterally.  Dermatologic Nails well groomed and normal in appearance. No open wounds. No skin lesions.  Orthopedic: Pain on palpation left extensor tendinitis pain along the course of the extensor tendon pain with resisted dorsiflexion of the digits.  No pain with plantarflexion of the digits resisted   Radiographs: 3 views of skeletally mature adult left foot: Severe bunion deformity  noted with first metatarsophalangeal joint arthritis noted.  Hallux valgus noted sesamoid position 7 out of 7 Assessment:   1. Extensor tendinitis of foot    Plan:  Patient was evaluated and treated and all questions answered.  Left foot extensor tendinitis - All signs and concerns were discussed with the patient in extensive detail - Clinically she has some improvement in cam boot immobilization status that she still has some residual pain she would like to discuss steroid injection for that. -A steroid injection was performed at left dorsal foot at point of maximal tenderness using 1% plain Lidocaine   and 10 mg of Kenalog. This was well tolerated.  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  I discussed this with patient in extensive detail she states understanding would like to proceed with surgery I believe she would benefit from left first metatarsophalangeal joint fusion.  She agrees with the plan I discussed my preoperative intra postoperative plan with the patient -Informed surgical risk consent was reviewed and read aloud to the patient.  I reviewed the films.  I have discussed my findings with the patient in great detail.  I have discussed all risks including but not limited to infection, stiffness, scarring, limp, disability, deformity, damage to blood vessels and nerves, numbness, poor healing, need for braces, arthritis, chronic pain, amputation, death.  All benefits and realistic expectations discussed in great detail.  I have made no promises as to the outcome.  I have provided realistic expectations.  I have offered the patient a 2nd opinion, which they have declined and assured me they preferred to proceed despite the risks    No follow-ups on file.

## 2024-04-22 DIAGNOSIS — E7849 Other hyperlipidemia: Secondary | ICD-10-CM | POA: Diagnosis not present

## 2024-04-22 DIAGNOSIS — E7800 Pure hypercholesterolemia, unspecified: Secondary | ICD-10-CM | POA: Diagnosis not present

## 2024-04-22 DIAGNOSIS — R7302 Impaired glucose tolerance (oral): Secondary | ICD-10-CM | POA: Diagnosis not present

## 2024-04-22 DIAGNOSIS — I10 Essential (primary) hypertension: Secondary | ICD-10-CM | POA: Diagnosis not present

## 2024-04-22 DIAGNOSIS — E559 Vitamin D deficiency, unspecified: Secondary | ICD-10-CM | POA: Diagnosis not present

## 2024-04-22 DIAGNOSIS — M810 Age-related osteoporosis without current pathological fracture: Secondary | ICD-10-CM | POA: Diagnosis not present

## 2024-04-25 ENCOUNTER — Telehealth: Payer: Self-pay | Admitting: Podiatry

## 2024-04-25 NOTE — Telephone Encounter (Signed)
 DOS- 05/20/2024  HALLUX MPJ FUSION LT- 71249  [employer] EFFECTIVE DATE- 08/09/2019  DEDUCTIBLE- N/A OOP- $4000 REMAINING- $3753.71 COINSURANCE- 0%  PER [facility] WEBSITE, PRIOR AUTH FOR CPT CODE 71249 HAS BEEN APPROVED FROM 05/20/2024-08/21/2023. AUTH# [id number]

## 2024-04-26 DIAGNOSIS — H401134 Primary open-angle glaucoma, bilateral, indeterminate stage: Secondary | ICD-10-CM | POA: Diagnosis not present

## 2024-05-01 DIAGNOSIS — Z1331 Encounter for screening for depression: Secondary | ICD-10-CM | POA: Diagnosis not present

## 2024-05-01 DIAGNOSIS — J4540 Moderate persistent asthma, uncomplicated: Secondary | ICD-10-CM | POA: Diagnosis not present

## 2024-05-01 DIAGNOSIS — Z23 Encounter for immunization: Secondary | ICD-10-CM | POA: Diagnosis not present

## 2024-05-01 DIAGNOSIS — E559 Vitamin D deficiency, unspecified: Secondary | ICD-10-CM | POA: Diagnosis not present

## 2024-05-01 DIAGNOSIS — Z0000 Encounter for general adult medical examination without abnormal findings: Secondary | ICD-10-CM | POA: Diagnosis not present

## 2024-05-01 DIAGNOSIS — I10 Essential (primary) hypertension: Secondary | ICD-10-CM | POA: Diagnosis not present

## 2024-05-01 DIAGNOSIS — Z1339 Encounter for screening examination for other mental health and behavioral disorders: Secondary | ICD-10-CM | POA: Diagnosis not present

## 2024-05-01 DIAGNOSIS — H35049 Retinal micro-aneurysms, unspecified, unspecified eye: Secondary | ICD-10-CM | POA: Diagnosis not present

## 2024-05-01 DIAGNOSIS — M8580 Other specified disorders of bone density and structure, unspecified site: Secondary | ICD-10-CM | POA: Diagnosis not present

## 2024-05-01 DIAGNOSIS — R82998 Other abnormal findings in urine: Secondary | ICD-10-CM | POA: Diagnosis not present

## 2024-05-01 DIAGNOSIS — E7800 Pure hypercholesterolemia, unspecified: Secondary | ICD-10-CM | POA: Diagnosis not present

## 2024-05-01 DIAGNOSIS — G63 Polyneuropathy in diseases classified elsewhere: Secondary | ICD-10-CM | POA: Diagnosis not present

## 2024-05-13 ENCOUNTER — Encounter
Admission: RE | Admit: 2024-05-13 | Discharge: 2024-05-13 | Disposition: A | Discharge status: Home / Self Care | Source: Ambulatory Visit | Attending: Podiatry | Admitting: Podiatry

## 2024-05-13 HISTORY — DX: Essential (primary) hypertension: I10

## 2024-05-13 NOTE — Patient Instructions (Addendum)
 Your procedure is scheduled on:  MONDAY OCTOBER 13  Report to the Registration Desk on the 1st floor of the [facility] To find out your arrival time, please call [phone] between 1PM - 3PM on:   FRIDAY OCTOBER 10  If your arrival time is 6:00 am, do not arrive before that time as the [facility] entrance doors do not open until 6:00 am.  REMEMBER: Instructions that are not followed completely may result in serious medical risk, up to and including death; or upon the discretion of your surgeon and anesthesiologist your surgery may need to be rescheduled.  Do not eat food after midnight the night before surgery.  No gum chewing or hard candies.  One week prior to surgery: Stop Anti-inflammatories (NSAIDS) such as Advil , Aleve, Ibuprofen , Motrin , Naproxen, Naprosyn and Aspirin based products such as Excedrin, Goody's Powder, BC Powder. Stop ANY OVER THE COUNTER supplements until after surgery. Cholecalciferol (VITAMIN D3)   You may however, continue to take Tylenol  if needed for pain up until the day of surgery.  Continue taking all of your other prescription medications up until the day of surgery.  ON THE DAY OF SURGERY DO NOT TAKE ANY MEDICATIONS  Use inhalers on the day of surgery and bring to the hospital. fluticasone -salmeterol (ADVAIR  DISKUS)  albuterol  (VENTOLIN  HFA)   No Alcohol  for 24 hours before or after surgery.  No Smoking including e-cigarettes for 24 hours before surgery.  No chewable tobacco products for at least 6 hours before surgery.  No nicotine patches on the day of surgery.  Do not use any recreational drugs for at least a week (preferably 2 weeks) before your surgery.  Please be advised that the combination of cocaine and anesthesia may have negative outcomes, up to and including death. If you test positive for cocaine, your surgery will be cancelled.  On the morning of surgery brush your teeth with toothpaste and water, you may rinse your mouth  with mouthwash if you wish. Do not swallow any toothpaste or mouthwash.  Use CHG Soap or wipes as directed on instruction sheet.  Do not wear jewelry, make-up, hairpins, clips or nail polish.  For welded (permanent) jewelry: bracelets, anklets, waist bands, etc.  Please have this removed prior to surgery.  If it is not removed, there is a chance that hospital personnel will need to cut it off on the day of surgery.  Do not wear lotions, powders, or perfumes.   Do not shave body hair from the neck down 48 hours before surgery.  Contact lenses, hearing aids and dentures may not be worn into surgery.  Do not bring valuables to the hospital. [facility] is not responsible for any missing/lost belongings or valuables.   Notify your doctor if there is any change in your medical condition (cold, fever, infection).  Wear comfortable clothing (specific to your surgery type) to the hospital.  After surgery, you can help prevent lung complications by doing breathing exercises.  Take deep breaths and cough every 1-2 hours.  If you are being discharged the day of surgery, you will not be allowed to drive home. You will need a responsible individual to drive you home and stay with you for 24 hours after surgery.   If you are taking public transportation, you will need to have a responsible individual with you.  Please call the Pre-admissions Testing Dept. at [phone] if you have any questions about these instructions.  Surgery Visitation Policy:  Patients having surgery  or a procedure may have two visitors.  Children under the age of 54 must have an adult with them who is not the patient.  Merchandiser, retail to address health-related social needs:  [email or web address]                                                                                                             Preparing for Surgery with CHLORHEXIDINE GLUCONATE (CHG) Soap  Chlorhexidine Gluconate  (CHG) Soap  o An antiseptic cleaner that kills germs and bonds with the skin to continue killing germs even after washing  o Used for showering the night before surgery and morning of surgery  Before surgery, you can play an important role by reducing the number of germs on your skin.  CHG (Chlorhexidine gluconate) soap is an antiseptic cleanser which kills germs and bonds with the skin to continue killing germs even after washing.  Please do not use if you have an allergy to CHG or antibacterial soaps. If your skin becomes reddened/irritated stop using the CHG.  1. Shower the NIGHT BEFORE SURGERY with CHG soap.  2. If you choose to wash your hair, wash your hair first as usual with your normal shampoo.  3. After shampooing, rinse your hair and body thoroughly to remove the shampoo.  4. Use CHG as you would any other liquid soap. You can apply CHG directly to the skin and wash gently with a clean washcloth.  5. Apply the CHG soap to your body only from the neck down. Do not use on open wounds or open sores. Avoid contact with your eyes, ears, mouth, and genitals (private parts). Wash face and genitals (private parts) with your normal soap.  6. Wash thoroughly, paying special attention to the area where your surgery will be performed.  7. Thoroughly rinse your body with warm water.  8. Do not shower/wash with your normal soap after using and rinsing off the CHG soap.  9. Do not use lotions, oils, etc., after showering with CHG.  10. Pat yourself dry with a clean towel.  11. Wear clean pajamas to bed the night before surgery.  12. Place clean sheets on your bed the night of your shower and do not sleep with pets.  13. Do not apply any deodorants/lotions/powders.  14. Please wear clean clothes to the hospital.  15. Remember to brush your teeth with your regular toothpaste.

## 2024-05-13 NOTE — Pre-Procedure Instructions (Signed)
 Multiple attempts made to make contact with the patient to perform her anesthesia interview prior to surgery but all attempts have been unsuccessful. Message left at the office to let them know the situation.

## 2024-05-15 ENCOUNTER — Other Ambulatory Visit: Payer: Self-pay

## 2024-05-15 ENCOUNTER — Encounter
Admission: RE | Admit: 2024-05-15 | Discharge: 2024-05-15 | Disposition: A | Discharge status: Home / Self Care | Source: Ambulatory Visit | Attending: Podiatry | Admitting: Podiatry

## 2024-05-15 ENCOUNTER — Encounter: Payer: Self-pay | Admitting: Podiatry

## 2024-05-15 VITALS — Ht 60.0 in | Wt 200.0 lb

## 2024-05-15 DIAGNOSIS — R002 Palpitations: Secondary | ICD-10-CM

## 2024-05-15 DIAGNOSIS — E7800 Pure hypercholesterolemia, unspecified: Secondary | ICD-10-CM

## 2024-05-15 DIAGNOSIS — Z6841 Body Mass Index (BMI) 40.0 and over, adult: Secondary | ICD-10-CM

## 2024-05-15 DIAGNOSIS — R911 Solitary pulmonary nodule: Secondary | ICD-10-CM

## 2024-05-15 DIAGNOSIS — J4540 Moderate persistent asthma, uncomplicated: Secondary | ICD-10-CM

## 2024-05-15 DIAGNOSIS — Z01812 Encounter for preprocedural laboratory examination: Secondary | ICD-10-CM

## 2024-05-15 DIAGNOSIS — M19072 Primary osteoarthritis, left ankle and foot: Secondary | ICD-10-CM

## 2024-05-15 NOTE — H&P (View-Only) (Signed)
 Attempted to call patient primary contact number x 3, son's number and so as the niece. All attempted call are unsuccessful.

## 2024-05-15 NOTE — Progress Notes (Signed)
 Attempted to call patient primary contact number x 3, son's number and so as the niece. All attempted call are unsuccessful.

## 2024-05-15 NOTE — Patient Instructions (Addendum)
 Your procedure is scheduled on:05/20/2024 Monday Report to the Registration Desk on the 1st floor of the [facility]. To find out your arrival time, please call [phone] between 1PM - 3PM on: 05/14/24 If your arrival time is 6:00 am, do not arrive before that time as the [facility] Mall entrance doors do not open until 6:00 am.  REMEMBER: Instructions that are not followed completely may result in serious medical risk, up to and including death; or upon the discretion of your surgeon and anesthesiologist your surgery may need to be rescheduled.  Do not eat food or drink anything after midnight the night before surgery.  No gum chewing or hard candies.    One week prior to surgery: Stop Anti-inflammatories (NSAIDS) such as Advil , Aleve, Ibuprofen , Motrin , Naproxen, Naprosyn and Aspirin based products such as Excedrin, Goody's Powder, BC Powder. Stop ANY OVER THE COUNTER supplements until after surgery.  You may however, continue to take Tylenol  if needed for pain up until the day of surgery.  Continue taking all of your other prescription medications up until the day of surgery.   Use inhalers as prescribed on the day of surgery and bring to the hospital.   No Alcohol  for 24 hours before or after surgery.  No Smoking including e-cigarettes for 24 hours before surgery.  No chewable tobacco products for at least 6 hours before surgery.  No nicotine patches on the day of surgery.  Do not use any recreational drugs for at least a week (preferably 2 weeks) before your surgery.  Please be advised that the combination of cocaine and anesthesia may have negative outcomes, up to and including death. If you test positive for cocaine, your surgery will be cancelled.  On the morning of surgery brush your teeth with toothpaste and water, you may rinse your mouth with mouthwash if you wish. Do not swallow any toothpaste or mouthwash.  Use CHG Soap or wipes as directed on instruction  sheet.-provided for you  Do not wear jewelry, make-up, hairpins, clips or nail polish.  Do not wear lotions, powders, or perfumes.   Do not shave body hair from the neck down 48 hours before surgery.  Contact lenses, hearing aids and dentures may not be worn into surgery.  Do not bring valuables to the hospital. [facility] is not responsible for any missing/lost belongings or valuables.    Notify your doctor if there is any change in your medical condition (cold, fever, infection).  Wear comfortable clothing (specific to your surgery type) to the hospital.  After surgery, you can help prevent lung complications by doing breathing exercises.  Take deep breaths and cough every 1-2 hours. Your doctor may order a device called an Incentive Spirometer to help you take deep breaths.   If you are being discharged the day of surgery, you will not be allowed to drive home. You will need a responsible individual to drive you home and stay with you for 24 hours after surgery.   Please call the Pre-admissions Testing Dept. at [phone] if you have any questions about these instructions.  Surgery Visitation Policy:  Patients having surgery or a procedure may have two visitors.  Children under the age of 71 must have an adult with them who is not the patient.     Merchandiser, retail to address health-related social needs:  [email or web address]  Preparing for Surgery with CHLORHEXIDINE GLUCONATE (CHG) Soap  Chlorhexidine Gluconate (CHG) Soap  o An antiseptic cleaner that kills germs and bonds with the skin to continue killing germs even after washing  o Used for showering the night before surgery and morning of surgery  Before surgery, you can play an important role by reducing the number of germs on your skin.  CHG (Chlorhexidine gluconate) soap is  an antiseptic cleanser which kills germs and bonds with the skin to continue killing germs even after washing.  Please do not use if you have an allergy to CHG or antibacterial soaps. If your skin becomes reddened/irritated stop using the CHG.  1. Shower the NIGHT BEFORE SURGERY with CHG soap.  2. If you choose to wash your hair, wash your hair first as usual with your normal shampoo.  3. After shampooing, rinse your hair and body thoroughly to remove the shampoo.  4. Use CHG as you would any other liquid soap. You can apply CHG directly to the skin and wash gently with a clean washcloth.  5. Apply the CHG soap to your body only from the neck down. Do not use on open wounds or open sores. Avoid contact with your eyes, ears, mouth, and genitals (private parts). Wash face and genitals (private parts) with your normal soap.  6. Wash thoroughly, paying special attention to the area where your surgery will be performed.  7. Thoroughly rinse your body with warm water.  8. Do not shower/wash with your normal soap after using and rinsing off the CHG soap.  9. Do not use lotions, oils, etc., after showering with CHG.  10. Pat yourself dry with a clean towel.  11. Wear clean pajamas to bed the night before surgery.  12. Place clean sheets on your bed the night of your shower and do not sleep with pets.  13. Do not apply any deodorants/lotions/powders.  14. Please wear clean clothes to the hospital.  15. Remember to brush your teeth with your regular toothpaste.

## 2024-05-15 NOTE — Pre-Procedure Instructions (Signed)
 Contacted Dr. Tobie regarding H&P. MD acknowledged and Pre-Admit is awaiting for the said document.

## 2024-05-16 ENCOUNTER — Telehealth: Payer: Self-pay | Admitting: Podiatry

## 2024-05-16 ENCOUNTER — Encounter: Payer: Self-pay | Admitting: Urgent Care

## 2024-05-16 ENCOUNTER — Encounter
Admission: RE | Admit: 2024-05-16 | Discharge: 2024-05-16 | Disposition: A | Discharge status: Home / Self Care | Source: Ambulatory Visit | Attending: Podiatry | Admitting: Podiatry

## 2024-05-16 DIAGNOSIS — M19072 Primary osteoarthritis, left ankle and foot: Secondary | ICD-10-CM | POA: Insufficient documentation

## 2024-05-16 DIAGNOSIS — R911 Solitary pulmonary nodule: Secondary | ICD-10-CM

## 2024-05-16 DIAGNOSIS — Z01812 Encounter for preprocedural laboratory examination: Secondary | ICD-10-CM

## 2024-05-16 DIAGNOSIS — Z01818 Encounter for other preprocedural examination: Secondary | ICD-10-CM | POA: Diagnosis not present

## 2024-05-16 DIAGNOSIS — E7800 Pure hypercholesterolemia, unspecified: Secondary | ICD-10-CM

## 2024-05-16 DIAGNOSIS — R002 Palpitations: Secondary | ICD-10-CM | POA: Diagnosis not present

## 2024-05-16 DIAGNOSIS — J4540 Moderate persistent asthma, uncomplicated: Secondary | ICD-10-CM | POA: Insufficient documentation

## 2024-05-16 LAB — BASIC METABOLIC PANEL WITH GFR
Anion gap: 5 (ref 5–15)
BUN: 12 mg/dL (ref 8–23)
CO2: 29 mmol/L (ref 22–32)
Calcium: 8.5 mg/dL — ABNORMAL LOW (ref 8.9–10.3)
Chloride: 107 mmol/L (ref 98–111)
Creatinine, Ser: 0.58 mg/dL (ref 0.44–1.00)
GFR, Estimated: >60 mL/min (ref >60)
Glucose, Bld: 102 mg/dL — ABNORMAL HIGH (ref 70–99)
Potassium: 3.9 mmol/L (ref 3.5–5.1)
Sodium: 141 mmol/L (ref 135–145)

## 2024-05-16 LAB — CBC
HCT: 36.8 % (ref 36.0–46.0)
Hemoglobin: 11.7 g/dL — ABNORMAL LOW (ref 12.0–15.0)
MCH: 31.5 pg (ref 26.0–34.0)
MCHC: 31.8 g/dL (ref 30.0–36.0)
MCV: 98.9 fL (ref 80.0–100.0)
Platelets: 262 K/uL (ref 150–400)
RBC: 3.72 MIL/uL — ABNORMAL LOW (ref 3.87–5.11)
RDW: 12.5 % (ref 11.5–15.5)
WBC: 7.1 K/uL (ref 4.0–10.5)
nRBC: 0 % (ref 0.0–0.2)

## 2024-05-16 NOTE — Telephone Encounter (Signed)
 Left message for Dr Eura medical assistant that we are needing the H & P forms that have been faxed 2 times now so pt can proceed with surgery on 05/20/24.

## 2024-05-20 ENCOUNTER — Ambulatory Visit

## 2024-05-20 ENCOUNTER — Ambulatory Visit
Admission: RE | Admit: 2024-05-20 | Discharge: 2024-05-20 | Disposition: A | Discharge status: Home / Self Care | Source: Ambulatory Visit | Attending: Podiatry | Admitting: Podiatry

## 2024-05-20 ENCOUNTER — Other Ambulatory Visit: Payer: Self-pay | Admitting: Podiatry

## 2024-05-20 ENCOUNTER — Other Ambulatory Visit: Payer: Self-pay

## 2024-05-20 ENCOUNTER — Encounter
Admission: RE | Disposition: A | Discharge status: Home / Self Care | Payer: Self-pay | Source: Ambulatory Visit | Attending: Podiatry

## 2024-05-20 ENCOUNTER — Ambulatory Visit: Admitting: Certified Registered"

## 2024-05-20 ENCOUNTER — Encounter: Payer: Self-pay | Admitting: Podiatry

## 2024-05-20 DIAGNOSIS — M21612 Bunion of left foot: Secondary | ICD-10-CM | POA: Insufficient documentation

## 2024-05-20 DIAGNOSIS — R7302 Impaired glucose tolerance (oral): Secondary | ICD-10-CM | POA: Insufficient documentation

## 2024-05-20 DIAGNOSIS — M1710 Unilateral primary osteoarthritis, unspecified knee: Secondary | ICD-10-CM | POA: Diagnosis not present

## 2024-05-20 DIAGNOSIS — R911 Solitary pulmonary nodule: Secondary | ICD-10-CM | POA: Insufficient documentation

## 2024-05-20 DIAGNOSIS — I10 Essential (primary) hypertension: Secondary | ICD-10-CM | POA: Insufficient documentation

## 2024-05-20 DIAGNOSIS — J4540 Moderate persistent asthma, uncomplicated: Secondary | ICD-10-CM | POA: Diagnosis not present

## 2024-05-20 DIAGNOSIS — E7800 Pure hypercholesterolemia, unspecified: Secondary | ICD-10-CM | POA: Insufficient documentation

## 2024-05-20 DIAGNOSIS — H4010X0 Unspecified open-angle glaucoma, stage unspecified: Secondary | ICD-10-CM | POA: Insufficient documentation

## 2024-05-20 DIAGNOSIS — M2012 Hallux valgus (acquired), left foot: Secondary | ICD-10-CM | POA: Diagnosis not present

## 2024-05-20 DIAGNOSIS — H35049 Retinal micro-aneurysms, unspecified, unspecified eye: Secondary | ICD-10-CM | POA: Insufficient documentation

## 2024-05-20 DIAGNOSIS — E559 Vitamin D deficiency, unspecified: Secondary | ICD-10-CM | POA: Insufficient documentation

## 2024-05-20 DIAGNOSIS — G63 Polyneuropathy in diseases classified elsewhere: Secondary | ICD-10-CM | POA: Insufficient documentation

## 2024-05-20 DIAGNOSIS — M19072 Primary osteoarthritis, left ankle and foot: Secondary | ICD-10-CM | POA: Insufficient documentation

## 2024-05-20 DIAGNOSIS — M7989 Other specified soft tissue disorders: Secondary | ICD-10-CM | POA: Diagnosis not present

## 2024-05-20 DIAGNOSIS — J439 Emphysema, unspecified: Secondary | ICD-10-CM | POA: Diagnosis not present

## 2024-05-20 DIAGNOSIS — M859 Disorder of bone density and structure, unspecified: Secondary | ICD-10-CM | POA: Insufficient documentation

## 2024-05-20 DIAGNOSIS — Z6839 Body mass index (BMI) 39.0-39.9, adult: Secondary | ICD-10-CM | POA: Diagnosis not present

## 2024-05-20 DIAGNOSIS — R339 Retention of urine, unspecified: Secondary | ICD-10-CM | POA: Diagnosis not present

## 2024-05-20 DIAGNOSIS — J302 Other seasonal allergic rhinitis: Secondary | ICD-10-CM | POA: Diagnosis not present

## 2024-05-20 DIAGNOSIS — M13872 Other specified arthritis, left ankle and foot: Secondary | ICD-10-CM | POA: Diagnosis not present

## 2024-05-20 DIAGNOSIS — J45909 Unspecified asthma, uncomplicated: Secondary | ICD-10-CM | POA: Diagnosis not present

## 2024-05-20 DIAGNOSIS — Z4789 Encounter for other orthopedic aftercare: Secondary | ICD-10-CM | POA: Diagnosis not present

## 2024-05-20 HISTORY — PX: ARTHRODESIS METATARSALPHALANGEAL JOINT (MTPJ): SHX6566

## 2024-05-20 SURGERY — FUSION, JOINT, GREAT TOE
Anesthesia: General | Site: Foot | Laterality: Left

## 2024-05-20 MED ORDER — CHLORHEXIDINE GLUCONATE 0.12 % MT SOLN
15.0000 mL | Freq: Once | OROMUCOSAL | Status: AC
  Administered 2024-05-20: 15 mL via OROMUCOSAL

## 2024-05-20 MED ORDER — FENTANYL CITRATE (PF) 100 MCG/2ML IJ SOLN
INTRAMUSCULAR | Status: AC
  Filled 2024-05-20: qty 2

## 2024-05-20 MED ORDER — PROPOFOL 10 MG/ML IV BOLUS
INTRAVENOUS | Status: DC | PRN
  Administered 2024-05-20: 100 ug/kg/min via INTRAVENOUS

## 2024-05-20 MED ORDER — MIDAZOLAM HCL 2 MG/2ML IJ SOLN
INTRAMUSCULAR | Status: AC
Start: 2024-05-20 — End: 2024-05-20
  Filled 2024-05-20: qty 2

## 2024-05-20 MED ORDER — BUPIVACAINE HCL (PF) 0.5 % IJ SOLN
INTRAMUSCULAR | Status: AC
  Filled 2024-05-20: qty 10

## 2024-05-20 MED ORDER — CEFAZOLIN SODIUM-DEXTROSE 2-4 GM/100ML-% IV SOLN
2.0000 g | Freq: Once | INTRAVENOUS | Status: AC
  Administered 2024-05-20: 2 g via INTRAVENOUS

## 2024-05-20 MED ORDER — BUPIVACAINE LIPOSOME 1.3 % IJ SUSP
INTRAMUSCULAR | Status: AC
  Filled 2024-05-20: qty 10

## 2024-05-20 MED ORDER — LIDOCAINE HCL URETHRAL/MUCOSAL 2 % EX GEL
CUTANEOUS | Status: DC | PRN
  Administered 2024-05-20: 1 via TOPICAL

## 2024-05-20 MED ORDER — FENTANYL CITRATE (PF) 100 MCG/2ML IJ SOLN
INTRAMUSCULAR | Status: DC | PRN
  Administered 2024-05-20 (×4): 25 ug via INTRAVENOUS

## 2024-05-20 MED ORDER — 0.9 % SODIUM CHLORIDE (POUR BTL) OPTIME
TOPICAL | Status: DC | PRN
Start: 2024-05-20 — End: 2024-05-20
  Administered 2024-05-20: 500 mL

## 2024-05-20 MED ORDER — MIDAZOLAM HCL 2 MG/2ML IJ SOLN
2.0000 mg | Freq: Once | INTRAMUSCULAR | Status: AC
  Administered 2024-05-20: 2 mg via INTRAVENOUS

## 2024-05-20 MED ORDER — SODIUM CHLORIDE 0.9 % IV SOLN: INTRAVENOUS | Status: DC

## 2024-05-20 MED ORDER — CHLORHEXIDINE GLUCONATE 0.12 % MT SOLN
OROMUCOSAL | Status: AC
  Filled 2024-05-20: qty 15

## 2024-05-20 MED ORDER — BUPIVACAINE LIPOSOME 1.3 % IJ SUSP
INTRAMUSCULAR | Status: DC | PRN
  Administered 2024-05-20: 10 mL via PERINEURAL

## 2024-05-20 MED ORDER — OXYCODONE-ACETAMINOPHEN 5-325 MG PO TABS: 1.0000 | ORAL_TABLET | ORAL | 0 refills | Status: DC | PRN

## 2024-05-20 MED ORDER — IBUPROFEN 800 MG PO TABS: 800.0000 mg | ORAL_TABLET | Freq: Four times a day (QID) | ORAL | 1 refills | Status: DC | PRN

## 2024-05-20 MED ORDER — ORAL CARE MOUTH RINSE: 15.0000 mL | Freq: Once | OROMUCOSAL | Status: AC

## 2024-05-20 MED ORDER — PROPOFOL 1000 MG/100ML IV EMUL
INTRAVENOUS | Status: AC
  Filled 2024-05-20: qty 100

## 2024-05-20 MED ORDER — LACTATED RINGERS IV SOLN: INTRAVENOUS | Status: DC | PRN

## 2024-05-20 MED ORDER — CEFAZOLIN SODIUM-DEXTROSE 2-4 GM/100ML-% IV SOLN
INTRAVENOUS | Status: AC
  Filled 2024-05-20: qty 100

## 2024-05-20 MED ORDER — BUPIVACAINE HCL (PF) 0.5 % IJ SOLN
INTRAMUSCULAR | Status: DC | PRN
  Administered 2024-05-20: 10 mL via PERINEURAL

## 2024-05-20 SURGICAL SUPPLY — 52 items
BIT DRILL LEOS 2.4 (BIT) IMPLANT
BIT DRILL LEOS SN 2.0 (DRILL) IMPLANT
BIT DRILL MINI AT AC (DRILL) IMPLANT
BLADE SURG 15 STRL LF DISP TIS (BLADE) ×4 IMPLANT
BLADE SURG MINI STRL (BLADE) IMPLANT
BNDG ELASTIC 4X5.8 VLCR NS LF (GAUZE/BANDAGES/DRESSINGS) ×2 IMPLANT
BNDG ESMARCH 4X12 STRL LF (GAUZE/BANDAGES/DRESSINGS) ×2 IMPLANT
BNDG GAUZE DERMACEA FLUFF 4 (GAUZE/BANDAGES/DRESSINGS) ×2 IMPLANT
BOOT WALKER LARGE (SOFTGOODS) IMPLANT
BOOT WALKER MEDIUM (SOFTGOODS) IMPLANT
CHLORAPREP W/TINT 26 (MISCELLANEOUS) ×2 IMPLANT
CUFF TOURN SGL QUICK 12 (TOURNIQUET CUFF) IMPLANT
CUFF TOURN SGL QUICK 18X4 (TOURNIQUET CUFF) IMPLANT
DRAPE FLUOR MINI C-ARM 54X84 (DRAPES) IMPLANT
ELECTRODE BLDE 4.0 EZ CLN MEGD (MISCELLANEOUS) ×2 IMPLANT
ELECTRODE REM PT RTRN 9FT ADLT (ELECTROSURGICAL) ×2 IMPLANT
GAUZE SPONGE 4X4 12PLY STRL (GAUZE/BANDAGES/DRESSINGS) ×2 IMPLANT
GAUZE XEROFORM 1X8 LF (GAUZE/BANDAGES/DRESSINGS) ×2 IMPLANT
GLOVE BIO SURGEON STRL SZ7.5 (GLOVE) ×2 IMPLANT
GLOVE BIOGEL PI IND STRL 7.0 (GLOVE) ×2 IMPLANT
GLOVE SURG SYN 7.0 PF PI (GLOVE) ×2 IMPLANT
GOWN STRL REUS W/ TWL XL LVL3 (GOWN DISPOSABLE) ×4 IMPLANT
KIT TURNOVER KIT A (KITS) ×2 IMPLANT
KWIRE LEOS SMOOTH 1.6 (WIRE) IMPLANT
LABEL OR SOLS (LABEL) ×2 IMPLANT
MANIFOLD NEPTUNE II (INSTRUMENTS) ×2 IMPLANT
NDL FILTER BLUNT 18X1 1/2 (NEEDLE) ×2 IMPLANT
NDL HYPO 25X1 1.5 SAFETY (NEEDLE) ×6 IMPLANT
NEEDLE FILTER BLUNT 18X1 1/2 (NEEDLE) ×1 IMPLANT
NEEDLE HYPO 25X1 1.5 SAFETY (NEEDLE) ×3 IMPLANT
NS IRRIG 500ML POUR BTL (IV SOLUTION) ×2 IMPLANT
PACK EXTREMITY ARMC (MISCELLANEOUS) ×2 IMPLANT
PAD ABD DERMACEA PRESS 5X9 (GAUZE/BANDAGES/DRESSINGS) ×2 IMPLANT
PAD CAST 4YDX4 CTTN HI CHSV (CAST SUPPLIES) ×2 IMPLANT
PENCIL SMOKE EVACUATOR (MISCELLANEOUS) ×2 IMPLANT
PLATE METAPH LEOS SM 0D LT (Screw) IMPLANT
PLATE TACK LEOS 1.6X10 (Plate) IMPLANT
SCREW HDLS ACUTRAK 28 NS (Screw) IMPLANT
SCREW LOCK 2.7X10 (Screw) IMPLANT
SCREW LOCK VA LEOS 3.5X14 (Screw) IMPLANT
SCREW NLOCK LEOS 2.7X10 (Screw) IMPLANT
SCREW NLOCK LEOS 3.5X14 (Screw) IMPLANT
STOCKINETTE M/LG 89821 (MISCELLANEOUS) ×2 IMPLANT
SUT MNCRL AB 3-0 PS2 27 (SUTURE) ×2 IMPLANT
SUT MNCRL AB 4-0 PS2 18 (SUTURE) ×2 IMPLANT
SUT MNCRL+ 5-0 UNDYED PC-3 (SUTURE) ×2 IMPLANT
SUT PROLENE 4 0 PS 2 18 (SUTURE) IMPLANT
SYR 10ML LL (SYRINGE) ×4 IMPLANT
TRAP FLUID SMOKE EVACUATOR (MISCELLANEOUS) ×2 IMPLANT
WATER STERILE IRR 500ML POUR (IV SOLUTION) ×2 IMPLANT
WIRE GUIDE SINGLE TROC 1.1X150 (WIRE) IMPLANT
WIRE Z .062 C-WIRE SPADE TIP (WIRE) IMPLANT

## 2024-05-20 NOTE — Interval H&P Note (Signed)
 History and Physical Interval Note:  05/20/2024 9:56 AM  Kirsten Horton  has presented today for surgery, with the diagnosis of Arthritis of left ankle.  The various methods of treatment have been discussed with the patient and family. After consideration of risks, benefits and other options for treatment, the patient has consented to  Procedure(s) with comments: FUSION, JOINT, GREAT TOE (Left) - IV SEDATION WITH POP BLOCK as a surgical intervention.  The patient's history has been reviewed, patient examined, no change in status, stable for surgery.  I have reviewed the patient's chart and labs.  Questions were answered to the patient's satisfaction.     Franky SHAUNNA Blanch

## 2024-05-20 NOTE — Op Note (Signed)
 Surgeon: Surgeon(s): Tobie Franky SQUIBB, DPM  Assistants: None Pre-operative diagnosis: Arthritis of left ankle  Post-operative diagnosis: same Procedure: Procedure(s) (LRB): FUSION, JOINT, GREAT TOE (Left)  Pathology: * No specimens in log *  Pertinent Intra-op findings: Moderate moderate arthritis noted of the left MPJ joint and left severe bunion deformity deformity noted Anesthesia: Monitor Anesthesia Care  Hemostasis: * Missing tourniquet times found for documented tourniquets in log: [id number] * EBL: 20 cc Materials: Acumed plates and screws and 3-0 Prolene 3-0 Monocryl Injectables: None Complications: None  Indications for surgery: A 83 y.o. female presents with left first metatarsophalangeal joint arthritis with underlying bunion deformity. Patient has failed all conservative therapy including but not limited to shoe gear modification padding protecting offloading. She wishes to have surgical correction of the foot/deformity. It was determined that patient would benefit from left first metatarsophalangeal joint fusion. Informed surgical risk consent was reviewed and read aloud to the patient.  I reviewed the films.  I have discussed my findings with the patient in great detail.  I have discussed all risks including but not limited to infection, stiffness, scarring, limp, disability, deformity, damage to blood vessels and nerves, numbness, poor healing, need for braces, arthritis, chronic pain, amputation, death.  All benefits and realistic expectations discussed in great detail.  I have made no promises as to the outcome.  I have provided realistic expectations.  I have offered the patient a 2nd opinion, which they have declined and assured me they preferred to proceed despite the risks   Procedure in detail: The patient was both verbally and visually identified by myself, the nursing staff, and anesthesia staff in the preoperative holding area. They were then transferred to the operating  room and placed on the operative table in supine position.  The patient was both verbally and visually identified by myself, the nursing staff, and anesthesia staff in the preoperative holding area. They were then transferred to the operating room and placed on the operative table in supine position.   Attention was directed to the dorsal aspect of the left  first metatarsophalangeal joint where a linear skin incision approximately 6cm long was made in the skin using a #15 blade. This incision was carried down through the subcutaneous tissue taking care to clamp and cauterize all neurovascular structures as necessary.  The capsule of 1st MPJ was identified. A linear capsulotomy was then performed in-line with the original skin incision and the capsule was reflected both medially and laterally to expose the head of the first metatarsal and the base of the proximal phalanx. The articular surface of the 1st metatarsal head was noted to show evidence ofextensive degeneration under direct visualization. The sagittal saw was then used to resect the dorsal, medial, and lateral prominences off of the 1st metatarsal. A rongeur was utilized to remove the dorsal exostosis of the proximal phalanx. A curette was used to remove some of the cartilage, and the remaining cartilage was removed using a burr until punctate bleeding could be noted on the metatarsal head and base of proximal phalanx. The site as flushed with copious amounts of sterile saline.  The distal aspect of the 1st metatarsal and the base of the proximal phalanx were then subchondrally drilled utilizing a pineapple burr. A Temporary K wire was then placed from distal-medial to proximal-lateral across the 1st MPJ, and the position was confirmed to be satisfactory utilizing fluoroscopy.. A dorsal right1st MPJ fusion locking plate was then applied and secured with two olive wires.  formal  medical plate was used to hold fixation..The position was confirmed to be  satisfactory with fluoroscopy, and two screws were placed both proximally and distally for a total of four Nonlocking/locking screws utilized. however prior to putting the screws in a lag screw was inserted in standard technique 1.The 1st MPJ was then stressed intraoperatively and no motion or gapping was noted across the fusion site. Final imaging via fluoroscopy was taken to confirm adequate placement and rectus position of the hallux. The surgical site was copiously irrigated with sterile saline. The capsule was then reapproximated with 3-0 Monocortical in a simple interrupted fashion. The subcutaneous tissue was then reapproximated with 4-0 monocryl in a running fashion. The subcuticular was performed utilizing 5-0 Monocryl.  At the conclusion of the procedure the patient was awoken from anesthesia and found to have tolerated the procedure well any complications. There were transferred to PACU with vital signs stable and vascular status intact.  Rekia Kujala, DPM

## 2024-05-20 NOTE — Transfer of Care (Signed)
 Immediate Anesthesia Transfer of Care Note  Patient: Kirsten Horton  Procedure(s) Performed: FUSION, JOINT, GREAT TOE (Left: Foot)  Patient Location: PACU  Anesthesia Type:General  Level of Consciousness: drowsy and patient cooperative  Airway & Oxygen Therapy: Patient Spontanous Breathing and Patient connected to nasal cannula oxygen  Post-op Assessment: Report given to RN and Post -op Vital signs reviewed and stable  Post vital signs: stable  Last Vitals:  Vitals Value Taken Time  BP 174/84 05/20/24 13:30  Temp 36.1 C 05/20/24 13:00  Pulse 57 05/20/24 13:32  Resp 20 05/20/24 13:32  SpO2 100 % 05/20/24 13:32  Vitals shown include unfiled device data.  Last Pain:  Vitals:   05/20/24 1318  TempSrc:   PainSc: 0-No pain         Complications: There were no known notable events for this encounter.

## 2024-05-20 NOTE — Anesthesia Preprocedure Evaluation (Addendum)
 Anesthesia Evaluation  Patient identified by MRN, date of birth, ID band Patient awake    Reviewed: Allergy & Precautions, H&P , NPO status , Patient's Chart, lab work & pertinent test results  Airway Mallampati: II  TM Distance: >3 FB Neck ROM: full    Dental  (+) Missing   Pulmonary asthma  Pulmonary Nodule   Pulmonary exam normal        Cardiovascular hypertension, Normal cardiovascular exam  Coronary artery calcifications on CT scan   Neuro/Psych negative neurological ROS  negative psych ROS   GI/Hepatic negative GI ROS, Neg liver ROS,,,  Endo/Other    Renal/GU negative Renal ROS  negative genitourinary   Musculoskeletal   Abdominal  (+) + obese  Peds  Hematology negative hematology ROS (+)   Anesthesia Other Findings Past Medical History: 03/20/2024: Adenomatous polyp of sigmoid colon 06/18/2017: Anatomical narrow angle, bilateral No date: Arthritis No date: Asthma     Comment:  seasonal 03/20/2024: Background diabetic retinopathy (HCC) 07/21/2009: Disorder of bone 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 03/20/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) 10/05/2006: Hyperlipidemia 06/12/2017: Hypermetropia of both eyes No date: Hypertension 03/20/2024: Incomplete emptying of bladder 10/17/2018: Knee effusion, right 03/20/2024: Lipoma, face 2016: Non-thrombocytopenic purpura 06/12/2017: Nuclear age-related cataract, both eyes 2014: Open-angle glaucoma 2008: Osteoarthritis, lower leg, localized 03/20/2024: Palpitations No date: Pneumonia 2010: Polyneuropathy 03/20/2024: Pulmonary nodule 05/04/2023: Rectal lesion 2024: Rectal nodule 07/23/2013: Retinal microaneurysm 2008: Rhinitis, allergic 03/20/2024: Vitamin D deficiency  Past Surgical History: No date: ABDOMINAL HYSTERECTOMY No date: COLONOSCOPY 05/04/2023: EUS; N/A     Comment:  Procedure: LOWER ENDOSCOPIC ULTRASOUND (EUS);  Surgeon:                Wilhelmenia Aloha Raddle., MD;  Location: [facility];                Service: Gastroenterology;  Laterality: N/A; 05/04/2023: FINE NEEDLE ASPIRATION; N/A     Comment:  Procedure: FINE NEEDLE ASPIRATION (FNA) LINEAR;                Surgeon: Wilhelmenia Aloha Raddle., MD;  Location: [facility]               ENDOSCOPY;  Service: Gastroenterology;  Laterality: N/A; 05/04/2023: FLEXIBLE SIGMOIDOSCOPY; N/A     Comment:  Procedure: FLEXIBLE SIGMOIDOSCOPY;  Surgeon: Wilhelmenia Aloha Raddle., MD;  Location: [facility];  Service:               Gastroenterology;  Laterality: N/A; No date: FOOT ARTHROPLASTY; Right 11/30/2011: TOTAL KNEE ARTHROPLASTY     Comment:  Procedure: TOTAL KNEE ARTHROPLASTY;  Surgeon: Toribio JULIANNA Chancy, MD;  Location: [facility] OR;  Service: Orthopedics;                Laterality: Left;  left total knee arthroplasty     Reproductive/Obstetrics negative OB ROS                              Anesthesia Physical Anesthesia Plan  ASA: 3  Anesthesia Plan: General   Post-op Pain Management: Regional block*   Induction: Intravenous  PONV Risk Score and Plan: Propofol  infusion and TIVA  Airway Management Planned: Natural Airway  Additional Equipment:   Intra-op Plan:   Post-operative Plan:   Informed Consent: I have reviewed the patients History  and Physical, chart, labs and discussed the procedure including the risks, benefits and alternatives for the proposed anesthesia with the patient or authorized representative who has indicated his/her understanding and acceptance.     Dental Advisory Given  Plan Discussed with: CRNA and Surgeon  Anesthesia Plan Comments:          Anesthesia Quick Evaluation

## 2024-05-20 NOTE — Anesthesia Procedure Notes (Signed)
 Anesthesia Regional Block: Popliteal block   Pre-Anesthetic Checklist: , timeout performed,  Correct Patient, Correct Site, Correct Laterality,  Correct Procedure, Correct Position, site marked,  Risks and benefits discussed,  Surgical consent,  Pre-op evaluation,  At surgeon's request and post-op pain management  Laterality: Left  Prep: chloraprep       Needles:  Injection technique: Single-shot  Needle Type: Stimiplex     Needle Length: 9cm  Needle Gauge: 22     Additional Needles:   Procedures:,,,, ultrasound used (permanent image in chart),,    Narrative:  Start time: 05/20/2024 10:42 AM End time: 05/20/2024 10:46 AM Injection made incrementally with aspirations every 5 mL.  Performed by: Personally  Anesthesiologist: Vicci Camellia Glatter, MD  Additional Notes: Patient consented for risk and benefits of nerve block including but not limited to nerve damage, failed block, bleeding and infection.  Patient voiced understanding.  Functioning IV was confirmed and monitors were applied.  Timeout done prior to procedure and prior to any sedation being given to the patient.  Patient confirmed procedure site prior to any sedation given to the patient. Sterile prep,hand hygiene and sterile gloves were used.  Minimal sedation used for procedure.  No paresthesia endorsed by patient during the procedure.  Negative aspiration and negative test dose prior to incremental administration of local anesthetic. The patient tolerated the procedure well with no immediate complications.

## 2024-05-20 NOTE — Discharge Instructions (Addendum)
 After Surgery Instructions   1) If you are recuperating from surgery anywhere other than home, please be sure to leave us  the number where you can be reached.  2) Go directly home and rest.  3) Keep the operated foot(feet) elevated six inches above the hip when sitting or lying down. This will help control swelling and pain.  4) Support the elevated foot and leg with pillows. DO NOT PLACE PILLOWS UNDER THE KNEE.  5) DO NOT REMOVE or get your bandages WET, unless you were given different instructions by your doctor to do so. This increases the risk of infection.  6) Wear your surgical shoe or surgical boot at all times when you are up on your feet.  7) A limited amount of pain and swelling may occur. The skin may take on a bruised appearance. DO NOT BE ALARMED, THIS IS NORMAL.  8) For slight pain and swelling, apply an ice pack directly over the bandages for 15 minutes only out of each hour of the day. Continue until seen in the office for your first post op visit. DON NOT     APPLY ANY FORM OF HEAT TO THE AREA.  9) Have prescriptions filled immediately and take as directed.  10) Drink lots of liquids, water and juice to stay hydrated.  11) CALL IMMEDIATELY IF:  *Bleeding continues until the following day of surgery  *Pain increases and/or does not respond to medication  *Bandages or cast appears to tight  *If your bandage gets wet  *Trip, fall or stump your surgical foot  *If your temperature goes above 101  *If you have ANY questions at all  YOU NOW CONTROL THE EFFORT OF YOUR RECOVERY. ADHERING TO THESE INSTRUCTIONS WILL OFFER YOU THE MOST COMPLETE RESULTS

## 2024-05-20 NOTE — Anesthesia Postprocedure Evaluation (Signed)
 Anesthesia Post Note  Patient: Kirsten Horton  Procedure(s) Performed: FUSION, JOINT, GREAT TOE (Left: Foot)  Patient location during evaluation: PACU Anesthesia Type: General Level of consciousness: awake and alert Pain management: pain level controlled Vital Signs Assessment: post-procedure vital signs reviewed and stable Respiratory status: spontaneous breathing, nonlabored ventilation and respiratory function stable Cardiovascular status: blood pressure returned to baseline and stable Postop Assessment: no apparent nausea or vomiting Anesthetic complications: no   There were no known notable events for this encounter.   Last Vitals:  Vitals:   05/20/24 1330 05/20/24 1346  BP: 139/60 (!) 158/69  Pulse: 67 61  Resp: 19 18  Temp: (!) 36.2 C (!) 36.2 C  SpO2: 96% 99%    Last Pain:  Vitals:   05/20/24 1346  TempSrc: Temporal  PainSc: 0-No pain                 Camellia Merilee Louder

## 2024-05-21 ENCOUNTER — Encounter: Payer: Self-pay | Admitting: Podiatry

## 2024-05-23 ENCOUNTER — Ambulatory Visit (HOSPITAL_COMMUNITY)

## 2024-05-29 ENCOUNTER — Ambulatory Visit (INDEPENDENT_AMBULATORY_CARE_PROVIDER_SITE_OTHER): Admitting: Podiatry

## 2024-05-29 ENCOUNTER — Ambulatory Visit (INDEPENDENT_AMBULATORY_CARE_PROVIDER_SITE_OTHER)

## 2024-05-29 VITALS — BP 177/69 | HR 67 | Temp 97.7°F

## 2024-05-29 DIAGNOSIS — M778 Other enthesopathies, not elsewhere classified: Secondary | ICD-10-CM | POA: Diagnosis not present

## 2024-05-29 DIAGNOSIS — Z9889 Other specified postprocedural states: Secondary | ICD-10-CM

## 2024-05-29 DIAGNOSIS — M19072 Primary osteoarthritis, left ankle and foot: Secondary | ICD-10-CM

## 2024-05-29 NOTE — Patient Instructions (Signed)

## 2024-05-29 NOTE — Progress Notes (Signed)
 Patient presents for post-op visit today, POV # 1 DOS 05/20/24 LT 1ST METATARSOPHALANGEAL JOINT FUSION/ ARTHRODESIS  Doing quite well. I did not have a lot of pain. The day after surgery a lot of pain, then used ice pack. Has had a lot of itching..  RN Notes: n/a  Vital Signs: Today's Vitals   05/29/24 0949  BP: (!) 177/69  Pulse: 67  Temp: 97.7 F (36.5 C)  TempSrc: Oral  PainSc: 0-No pain      Radiographs: [x]  Taken []  Not taken  Surgical Site Assessment:  - Dressing:  []  Minimal dry blood, intact []  Reinforced   [x]  Changed     -RN Notes: moderate old blood on dressing through to ace wrap.   - Incision:  [x]  CDI (clean, dry, intact)  [x]  Mild erythema  []  Drainage noted   -RN Notes: n/a  - Swelling:  []  None  []  Mild  [x]  Moderate   []  Significant     -RN Notes: n/a  - Bruising:  []  None  [x]  Present: primarily around surgical site.    - Sutures/Staples:  []  None [x]  Intact  []  Removed Today  [x]  Plan to remove at next visit   -Cast/Splint/Pins: [x]  None []  Intact []  Removed Today []  Plan to remove at next visit []  Replaced  -Signs of infection:  []  None  [x]  Present - Describe: warmth, moderated swelling.   -DME:    []  None [x]  AFW []  Surgical shoe []  Cast  []  Splint  -Walking status:  [x]  Full WB  []  Partial WB  []  NWB  -Utilizing device:  []  None []  Knee Scooter [x]  Cane []  Wheelchair    DVT assessment:  []  Denies symptoms []  Chest pain/SOB [x]  Pain in calf/redness/warmth    -RN Notes: Calf redness and pitting edema.    Redressed DSD and ace wrap. Educated on signs of infection, proper dressing care, pain management, and weight bearing status. Patient will contact provider with any new or worsening symptoms. The provider assessed the patient today and reviewed instructions regarding plan of care.

## 2024-06-05 ENCOUNTER — Ambulatory Visit (HOSPITAL_COMMUNITY)
Admission: RE | Admit: 2024-06-05 | Discharge: 2024-06-05 | Disposition: A | Discharge status: Home / Self Care | Source: Ambulatory Visit | Attending: Pulmonary Disease | Admitting: Pulmonary Disease

## 2024-06-05 DIAGNOSIS — R911 Solitary pulmonary nodule: Secondary | ICD-10-CM | POA: Insufficient documentation

## 2024-06-05 DIAGNOSIS — I517 Cardiomegaly: Secondary | ICD-10-CM | POA: Diagnosis not present

## 2024-06-05 DIAGNOSIS — I700 Atherosclerosis of aorta: Secondary | ICD-10-CM | POA: Diagnosis not present

## 2024-06-12 ENCOUNTER — Ambulatory Visit (INDEPENDENT_AMBULATORY_CARE_PROVIDER_SITE_OTHER): Admitting: Podiatry

## 2024-06-12 DIAGNOSIS — M778 Other enthesopathies, not elsewhere classified: Secondary | ICD-10-CM

## 2024-06-12 NOTE — Progress Notes (Signed)
 Patient presents for post-op visit today, POV # 2 DOS 05/20/24 LT 1ST METATARSOPHALANGEAL JOINT FUSION/ ARTHRODESIS  Doing good since last visit. Itches like the dickens. Swelling has went down..  RN Notes: n/a  Vital Signs: Today's Vitals   06/12/24 0930  PainSc: 0-No pain      Radiographs: []  Taken [x]  Not taken  Surgical Site Assessment:  - Dressing:  [x]  Minimal dry blood, intact []  Reinforced   []  Changed     -RN Notes: n/a  - Incision:  [x]  CDI (clean, dry, intact)  [x]  Mild erythema  []  Drainage noted   -RN Notes: n/a  - Swelling:  []  None  [x]  Mild  []  Moderate   []  Significant     -RN Notes: n/a  - Bruising:  [x]  None  []  Present: n/a   - Sutures/Staples:  []  None [x]  Intact  [x]  Removed Today  []  Plan to remove at next visit   -Cast/Splint/Pins: [x]  None []  Intact []  Removed Today []  Plan to remove at next visit []  Replaced  -Signs of infection:  [x]  None  []  Present - Describe: n/a  -DME:    []  None [x]  AFW []  Surgical shoe []  Cast  []  Splint  -Walking status:  [x]  Full WB  []  Partial WB  []  NWB  -Utilizing device:  [x]  None []  Knee Scooter []  Crutches []  Wheelchair    DVT assessment:  [x]  Denies symptoms []  Chest pain/SOB []  Pain in calf/redness/warmth   Redressed DSD and ace wrap. Educated on signs of infection, proper dressing care, pain management, and weight bearing status. Patient will contact provider with any new or worsening symptoms. The provider assessed the patient today and reviewed instructions regarding plan of care.

## 2024-07-12 ENCOUNTER — Ambulatory Visit: Admitting: Podiatry

## 2024-07-12 ENCOUNTER — Ambulatory Visit

## 2024-07-12 ENCOUNTER — Ambulatory Visit: Payer: Self-pay | Admitting: Pulmonary Disease

## 2024-07-12 DIAGNOSIS — M7752 Other enthesopathy of left foot: Secondary | ICD-10-CM | POA: Diagnosis not present

## 2024-07-12 DIAGNOSIS — M778 Other enthesopathies, not elsewhere classified: Secondary | ICD-10-CM

## 2024-07-12 NOTE — Progress Notes (Unsigned)
 Subjective:  Patient ID: Kirsten Horton, female    DOB: 02-Aug-1941,  MRN: [id number]  Chief Complaint  Patient presents with   Routine Post Op    POV # 3 DOS 05/20/24 LT 1ST METATARSOPHALANGEAL JOINT FUSION/ ARTHRODESIS(    83 y.o. female presents with the above complaint.  Patient presents with first metatarsophalangeal joint follow-up.  She states she is doing okay.  Pain is controlled.  This is her third visit just wants to make sure everything is healing okay denies any other acute complaints.  No nausea fever chills vomiting   Review of Systems: Negative except as noted in the HPI. Denies N/V/F/Ch.  Past Medical History:  Diagnosis Date   Adenomatous polyp of sigmoid colon 03/20/2024   Anatomical narrow angle, bilateral 06/18/2017   Arthritis    Asthma    seasonal   Background diabetic retinopathy (HCC) 03/20/2024   Disorder of bone 07/21/2009   Diverticula of intestine 2014   Gastrointestinal stromal tumor (GIST) (HCC) 03/20/2024   Hyperlipidemia 10/05/2006   Hypermetropia of both eyes 06/12/2017   Hypertension    Incomplete emptying of bladder 03/20/2024   Knee effusion, right 10/17/2018   Lipoma, face 03/20/2024   Non-thrombocytopenic purpura 2016   Nuclear age-related cataract, both eyes 06/12/2017   Open-angle glaucoma 2014   Osteoarthritis, lower leg, localized 2008   Palpitations 03/20/2024   Pneumonia    Polyneuropathy 2010   Pulmonary nodule 03/20/2024   Rectal lesion 05/04/2023   Rectal nodule 2024   Retinal microaneurysm 07/23/2013   Rhinitis, allergic 2008   Vitamin D deficiency 03/20/2024   Current Medications[1]  Tobacco Use History[2]  Allergies[3] Objective:  There were no vitals filed for this visit. There is no height or weight on file to calculate BMI. Constitutional Well developed. Well nourished.  Vascular Dorsalis pedis pulses palpable bilaterally. Posterior tibial pulses palpable bilaterally. Capillary refill normal to  all digits.  No cyanosis or clubbing noted. Pedal hair growth normal.  Neurologic Normal speech. Oriented to person, place, and time. Epicritic sensation to light touch grossly present bilaterally.  Dermatologic Nails well groomed and normal in appearance. No open wounds. No skin lesions.  Orthopedic: No pain on palpation to the hardware.  No range of motion noted at the first metatarsophalangeal joint.  Stiff left big toe noted.  Due to the arthrodesis   Radiographs: 3 views of skeletally mature adult l hardware is intact no signs of backing or loosening noted good correction alignment noted reduction of deformity noted.  Ft foot: Assessment:   1. Extensor tendinitis of foot   2. Arthritis of first metatarsophalangeal (MTP) joint of left foot   3. S/P foot surgery    Plan:  Patient was evaluated and treated and all questions answered.  Left first metatarsophalangeal joint arthrodesis postop visit. - She states she is doing okay pain is controlled here to get official discharge.  Pain scale is 0 out of 10.  Return to regular shoes no activities.  She states understanding.  If any foot and ankle issues on future she will come back and see me.  No follow-ups on file.     [1]  Current Outpatient Medications:    acetaminophen  (TYLENOL ) 500 MG tablet, Take 1,000 mg by mouth daily as needed for moderate pain., Disp: , Rfl:    albuterol  (VENTOLIN  HFA) 108 (90 Base) MCG/ACT inhaler, Inhale 1-2 puffs into the lungs every 4 (four) hours as needed. For shortness of breath, Disp: 8 g, Rfl: 11  Cholecalciferol (VITAMIN D3) 50 MCG (2000 UT) capsule, Take 2,000 Units by mouth daily., Disp: , Rfl:    fluticasone -salmeterol (ADVAIR  DISKUS) 250-50 MCG/ACT AEPB, Inhale 1 puff into the lungs in the morning and at bedtime., Disp: 60 each, Rfl: 11   latanoprost  (XALATAN ) 0.005 % ophthalmic solution, Place 1 drop into both eyes at bedtime., Disp: , Rfl: 11 [2]  Social History Tobacco Use  Smoking  Status Never  Smokeless Tobacco Never  [3]  Allergies Allergen Reactions   Penicillin V Potassium Rash   Penicillins Hives and Rash    Did it involve swelling of the face/tongue/throat, SOB, or low BP? Yes Did it involve sudden or severe rash/hives, skin peeling, or any reaction on the inside of your mouth or nose? No  Did you need to seek medical attention at a hospital or doctor's office? Yes When did it last happen?     more than 10 years  If all above answers are NO, may proceed with cephalosporin use.

## 2024-07-22 ENCOUNTER — Telehealth: Payer: Self-pay

## 2024-07-26 NOTE — Telephone Encounter (Signed)
 Patient called again today - she needs a return to work note. [employer] call her when it's ready for pick up [phone]

## 2024-07-29 ENCOUNTER — Encounter: Payer: Self-pay | Admitting: Podiatry

## 2024-09-13 ENCOUNTER — Other Ambulatory Visit: Payer: Self-pay | Admitting: Medical Genetics

## 2024-09-13 DIAGNOSIS — Z006 Encounter for examination for normal comparison and control in clinical research program: Secondary | ICD-10-CM
